# Patient Record
Sex: Female | Born: 1955 | Race: White | Hispanic: No | Marital: Married | Smoking: Never smoker
Health system: Southern US, Community
[De-identification: ages and names within clinical notes are randomized; demographics above are authoritative.]

## PROBLEM LIST (undated history)

## (undated) DIAGNOSIS — K219 Gastro-esophageal reflux disease without esophagitis: Secondary | ICD-10-CM

## (undated) DIAGNOSIS — D649 Anemia, unspecified: Secondary | ICD-10-CM

## (undated) DIAGNOSIS — E119 Type 2 diabetes mellitus without complications: Secondary | ICD-10-CM

## (undated) DIAGNOSIS — C50919 Malignant neoplasm of unspecified site of unspecified female breast: Secondary | ICD-10-CM

## (undated) DIAGNOSIS — M069 Rheumatoid arthritis, unspecified: Secondary | ICD-10-CM

## (undated) DIAGNOSIS — C8590 Non-Hodgkin lymphoma, unspecified, unspecified site: Secondary | ICD-10-CM

## (undated) DIAGNOSIS — I10 Essential (primary) hypertension: Secondary | ICD-10-CM

## (undated) DIAGNOSIS — F32A Depression, unspecified: Secondary | ICD-10-CM

## (undated) DIAGNOSIS — C801 Malignant (primary) neoplasm, unspecified: Secondary | ICD-10-CM

## (undated) DIAGNOSIS — K759 Inflammatory liver disease, unspecified: Secondary | ICD-10-CM

## (undated) HISTORY — PX: PARTIAL MASTECTOMY: SHX2703

## (undated) HISTORY — DX: Anemia, unspecified: D64.9

## (undated) HISTORY — DX: Rheumatoid arthritis, unspecified: M06.9

## (undated) HISTORY — DX: Depression, unspecified: F32.A

## (undated) HISTORY — PX: ABDOMINAL SURGERY: SHX537

## (undated) HISTORY — DX: Type 2 diabetes mellitus without complications: E11.9

## (undated) HISTORY — PX: EXCISION BIOPSY WITH NEEDLE LOCALIZATION: SHX2709

## (undated) HISTORY — PX: HYSTEROSCOPY: SHX211

## (undated) HISTORY — DX: Non-Hodgkin lymphoma, unspecified, unspecified site: C85.90

## (undated) HISTORY — DX: Malignant (primary) neoplasm, unspecified: C80.1

## (undated) HISTORY — DX: Malignant neoplasm of unspecified site of unspecified female breast: C50.919

## (undated) HISTORY — DX: Gastro-esophageal reflux disease without esophagitis: K21.9

## (undated) HISTORY — DX: Inflammatory liver disease, unspecified: K75.9

## (undated) HISTORY — DX: Essential (primary) hypertension: I10

## (undated) HISTORY — PX: AXILLARY NODE DISSECTION: SHX2708

---

## 1988-11-16 DIAGNOSIS — K759 Inflammatory liver disease, unspecified: Secondary | ICD-10-CM

## 1988-11-16 HISTORY — DX: Inflammatory liver disease, unspecified: K75.9

## 2015-05-10 DIAGNOSIS — M069 Rheumatoid arthritis, unspecified: Secondary | ICD-10-CM | POA: Insufficient documentation

## 2017-11-16 DIAGNOSIS — C8590 Non-Hodgkin lymphoma, unspecified, unspecified site: Secondary | ICD-10-CM

## 2017-11-16 HISTORY — DX: Non-Hodgkin lymphoma, unspecified, unspecified site: C85.90

## 2018-05-09 DIAGNOSIS — C8590 Non-Hodgkin lymphoma, unspecified, unspecified site: Secondary | ICD-10-CM | POA: Insufficient documentation

## 2018-11-16 HISTORY — PX: ABDOMINAL SURGERY: SHX537

## 2019-11-17 DIAGNOSIS — C50919 Malignant neoplasm of unspecified site of unspecified female breast: Secondary | ICD-10-CM

## 2019-11-17 HISTORY — DX: Malignant neoplasm of unspecified site of unspecified female breast: C50.919

## 2021-01-04 ENCOUNTER — Ambulatory Visit
Admission: RE | Admit: 2021-01-04 | Discharge: 2021-01-04 | Disposition: A | Discharge status: Home / Self Care | Payer: Self-pay | Source: Ambulatory Visit

## 2021-01-04 DIAGNOSIS — Z1239 Encounter for other screening for malignant neoplasm of breast: Secondary | ICD-10-CM

## 2021-03-25 DIAGNOSIS — C50919 Malignant neoplasm of unspecified site of unspecified female breast: Secondary | ICD-10-CM | POA: Insufficient documentation

## 2021-04-17 ENCOUNTER — Ambulatory Visit
Admission: RE | Admit: 2021-04-17 | Discharge: 2021-04-17 | Disposition: A | Discharge status: Home / Self Care | Payer: Self-pay | Source: Ambulatory Visit

## 2021-04-30 ENCOUNTER — Ambulatory Visit
Admission: RE | Admit: 2021-04-30 | Discharge: 2021-04-30 | Disposition: A | Discharge status: Home / Self Care | Payer: Self-pay | Source: Ambulatory Visit

## 2021-04-30 DIAGNOSIS — Z1239 Encounter for other screening for malignant neoplasm of breast: Secondary | ICD-10-CM

## 2021-06-17 ENCOUNTER — Ambulatory Visit
Admission: RE | Admit: 2021-06-17 | Discharge: 2021-06-17 | Disposition: A | Discharge status: Home / Self Care | Payer: Self-pay | Source: Ambulatory Visit

## 2021-06-17 DIAGNOSIS — Z1239 Encounter for other screening for malignant neoplasm of breast: Secondary | ICD-10-CM

## 2021-12-17 DIAGNOSIS — E119 Type 2 diabetes mellitus without complications: Secondary | ICD-10-CM | POA: Insufficient documentation

## 2022-01-25 ENCOUNTER — Ambulatory Visit
Admission: RE | Admit: 2022-01-25 | Discharge: 2022-01-25 | Disposition: A | Discharge status: Home / Self Care | Payer: Self-pay | Source: Ambulatory Visit

## 2022-01-25 DIAGNOSIS — Z1239 Encounter for other screening for malignant neoplasm of breast: Secondary | ICD-10-CM

## 2022-04-01 ENCOUNTER — Telehealth: Payer: Self-pay

## 2022-04-01 NOTE — Telephone Encounter (Signed)
Received records from [facility] Dr. Erskine Speed records. Still waiting for medical oncologist records.

## 2022-04-01 NOTE — Telephone Encounter (Signed)
Returned the patients call from 03/31/22 at 12:48 pm.  Spoke with the patient and she has recently moved to the area and her Non Hodgkin's Lymphoma is in remission.  She reviewed the I should reach out to provider Dr. Inetta Fermo to request her records to be faxed over to my attention.  We reviewed that once her records are received I will give her a call back .

## 2022-04-01 NOTE — Telephone Encounter (Signed)
[facility] New Patient Coordinator Note    Physician/Location Preference:    Location Preference: [facility]  Physician Preference: pt will do research and let me know    Referral:    Referring Provider: self     Is Referral required per insurance? No      History:    Personal Hx of Cancer: Yes - breast cancer     Prior Chemotherapy - No  Prior Radiation - Yes - Location Performed: Other no     Prior Surgery related to Cancer - Yes - Location Performed: Other partial [facility]aste[facility] for [facility]    Family Hx of Cancer :     Biopsy History:        Imaging History:    Prior Imaging: Yes    Type of Imaging: PET scan and mammo in April   Location Performed: [facility] [place]     Other:     Are there patient owned records that will be brought to the first appointment?No    Has the Appointment been scheduled? No: Will post date/time/physician/clinic when available   Sent request for medical records

## 2022-04-06 NOTE — Telephone Encounter (Signed)
[facility] New Patient Coordinator Note    Physician/Location Preference:    Location Preference: [facility]    Physician Preference: First Available    Referral:    Referring Provider: Transfer of Care from [place]    Is Referral required per insurance? No      History:    Personal Hx of Cancer: Yes - Follicular Lymphoma in remission and Breast Cancer       Has the Appointment been scheduled? Yes The patient has been scheduled with Dr. Haywood Lasso on 09/11/22 at 11 am at [facility].  I reviewed the location of the clinic with the patient.

## 2022-04-15 NOTE — Telephone Encounter (Signed)
Medical oncology records were received at heme department. Was able to pull records in epic. Called pt and left VM to call back and schedule. Also sent email to pt.

## 2022-04-22 NOTE — Telephone Encounter (Signed)
Called pt and was advised she will be out of town from 06/13-06/21 and need apt after that. Scheduled pt with Dr. Pandora Leiter on 06/30 @ 3pm in [facility]. Pt confirmed apt date time and location

## 2022-04-30 ENCOUNTER — Encounter (FREE_STANDING_LABORATORY_FACILITY): Payer: Medicare Other

## 2022-04-30 ENCOUNTER — Ambulatory Visit: Payer: Self-pay

## 2022-04-30 DIAGNOSIS — C50912 Malignant neoplasm of unspecified site of left female breast: Secondary | ICD-10-CM

## 2022-04-30 DIAGNOSIS — C779 Secondary and unspecified malignant neoplasm of lymph node, unspecified: Secondary | ICD-10-CM

## 2022-05-05 ENCOUNTER — Other Ambulatory Visit: Payer: Self-pay

## 2022-05-05 DIAGNOSIS — M25562 Pain in left knee: Secondary | ICD-10-CM

## 2022-05-05 LAB — LAB USE ONLY - HISTORICAL SURGICAL PATHOLOGY

## 2022-05-06 ENCOUNTER — Ambulatory Visit: Payer: Medicare Other

## 2022-05-08 ENCOUNTER — Other Ambulatory Visit: Payer: Self-pay

## 2022-05-08 ENCOUNTER — Ambulatory Visit
Admission: RE | Admit: 2022-05-08 | Discharge: 2022-05-08 | Disposition: A | Discharge status: Home / Self Care | Payer: Medicare Other | Source: Ambulatory Visit

## 2022-05-08 DIAGNOSIS — M25562 Pain in left knee: Secondary | ICD-10-CM | POA: Insufficient documentation

## 2022-05-11 ENCOUNTER — Other Ambulatory Visit: Payer: Self-pay

## 2022-05-11 DIAGNOSIS — K430 Incisional hernia with obstruction, without gangrene: Secondary | ICD-10-CM | POA: Insufficient documentation

## 2022-05-15 ENCOUNTER — Encounter: Payer: Self-pay | Admitting: Hematology & Oncology

## 2022-05-15 ENCOUNTER — Ambulatory Visit: Payer: Medicare Other | Attending: Hematology & Oncology | Admitting: Hematology & Oncology

## 2022-05-15 VITALS — BP 118/75 | HR 81 | Temp 97.9°F | Resp 18 | Ht 66.5 in | Wt 229.6 lb

## 2022-05-15 DIAGNOSIS — C8213 Follicular lymphoma grade II, intra-abdominal lymph nodes: Secondary | ICD-10-CM | POA: Insufficient documentation

## 2022-05-15 DIAGNOSIS — C50912 Malignant neoplasm of unspecified site of left female breast: Secondary | ICD-10-CM | POA: Insufficient documentation

## 2022-05-15 DIAGNOSIS — M8588 Other specified disorders of bone density and structure, other site: Secondary | ICD-10-CM | POA: Insufficient documentation

## 2022-05-15 DIAGNOSIS — C50112 Malignant neoplasm of central portion of left female breast: Secondary | ICD-10-CM | POA: Insufficient documentation

## 2022-05-15 DIAGNOSIS — Z170 Estrogen receptor positive status [ER+]: Secondary | ICD-10-CM | POA: Insufficient documentation

## 2022-05-15 NOTE — Progress Notes (Signed)
[facility]  [address] [place], [place], [place] [address]  P: [phone]      [facility]  [address], [place], [place] [address]  P: [phone] F: [phone]      Date of Encounter: May 15, 2022    Identification: LEFT breast cancer cT1c N0(sn) M0 (stage 1A) s/p partial mastectomy/SLNB. HR+/HER2 1+, Oncotype RS=11, radiation (EOT 10/20/21). 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 In remission by PET/CT as of 02/2022.    Referring provider: self.    Oncology care team:  Breast surgeon: Treated in [place]  Rad onc: Treated in [place]  Med-onc: Transferring care from [place] --> Dr.Briggitte Boline Pandora Leiter      HPI:  Stacie Christian is a 66 y.o. year old woman who relocated from [place] to move in with her daughter.   Available records have been reviewed.     In 2019, she presented with vomiting and abdominal pain and was found to have left upper abdominal mass inseparable from duodenum and jejunum as well as in the mesentery. S/p enteroscopy and exp lap. 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 S/p BR x 4 cycles (10/2018) and bypass surgery. Maintenance therapy with Rituximab q69months x 2 years.    PET/CT 03/07/21 showed CR for lymphoma but revealed a hypermetabolic focus in the inferior left breast (8mm, SUV 3.5)  04/17/21 MMG/US showed a 1.6cm irregular mass in the left breast (3 o'clock 7cm from the nipple) and a 1.2cm area of subtle architectural distortion in the left breast (1 o'clock middle depth)   Bx 3 o'clock lesion: invasive mammary carcinoma with DCIS (ER 100%, PR 95%, HER2 1+, Ki-67 30%).   Bx 1 o'clock lesion: benign fibrous stromal tissue.     06/17/21 LEFT breast partial mastectomy/SLNB: invasive mammary carcinoma, no special type, tumor size 1.2cm, No LVI, margins negative--pT1cN0  Oncotype RS=11 (distant recurrence risk  at 9 years with TAM or AI 13%, no chemotherapy benefit)    06/27/21 DEXA normal.   07/14/21 remission by PET/CT. No FDG avid lymphadenopathy.     S/p breast radiation (09/16/21-10/07/21) and boost (10/14/21-10/20/21)  Aromasin since 12/2021. Other than night sweats, she's been tolerating Aromasin well.     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   02/25/22 Bilateral MMG: benign    05/15/22 Established care with me.   She is scheduled to see Finis Bud in 08/2022.     Menopausal status: postmenopausal    Genetic test: N/A        Past Medical History:   Diagnosis Date    Anemia     Depression     Diabetes mellitus     Hepatitis     Hypertension     Malignant neoplasm        Past Surgical History:   Procedure Laterality Date    ABDOMINAL SURGERY  2022    AXILLARY NODE DISSECTION  2022    EXCISION BIOPSY WITH NEEDLE LOCALIZATION  2022    HYSTEROSCOPY      PARTIAL MASTECTOMY  2022       Family History   Problem Relation Age of Onset    Cancer Mother     Cancer Father     Breast cancer Maternal Aunt        Current Outpatient Medications on File Prior  to Visit   Medication Sig Dispense Refill    celecoxib (CeleBREX) 200 MG capsule TAKE ONE (1) CAPSULE(S) BY MOUTH ONCE A DAY WITH FOOD FOR 30 DAYS.      chlorthalidone 25 MG tablet Take 1 tablet (25 mg) by mouth daily      CRANBERRY PO Take 36 mg by mouth daily      esomeprazole (NexIUM) 40 MG capsule Take 1 capsule (40 mg) by mouth daily      exemestane (AROMASIN) 25 MG tablet       hydroxychloroquine (PLAQUENIL) 200 MG tablet Take 1 tablet (200 mg) by mouth daily      lisinopril (ZESTRIL) 20 MG tablet Take 1 tablet (20 mg) by mouth daily      montelukast (SINGULAIR) 10 MG tablet Take 1 tablet (10 mg) by mouth daily      PARoxetine (PAXIL) 20 MG tablet TAKE ONE (1) TABLET(S) BY MOUTH ONCE A DAY.      Semaglutide (Rybelsus) 7 MG Tab       UNABLE TO FIND Take 10 mg by mouth daily Med Name: Donperidone      vitamin D, ergocalciferol, (DRISDOL)  50000 UNIT Cap TAKE ONE (1) CAPSULE(S) BY MOUTH EVERY WEEK.       No current facility-administered medications on file prior to visit.       Social History     Socioeconomic History    Marital status: Married   Tobacco Use    Smoking status: Never    Smokeless tobacco: Never   Vaping Use    Vaping Use: Never used   Substance and Sexual Activity    Alcohol use: Not Currently    Drug use: Never    Sexual activity: Not Currently     Birth control/protection: None     Social Determinants of Health     Financial Resource Strain: Low Risk  (05/09/2022)    Overall Financial Resource Strain (CARDIA)     Difficulty of Paying Living Expenses: Not very hard   Food Insecurity: No Food Insecurity (05/09/2022)    Hunger Vital Sign     Worried About Running Out of Food in the Last Year: Never true     Ran Out of Food in the Last Year: Never true   Transportation Needs: No Transportation Needs (05/09/2022)    PRAPARE - Therapist, art (Medical): No     Lack of Transportation (Non-Medical): No   Physical Activity: Insufficiently Active (05/09/2022)    Exercise Vital Sign     Days of Exercise per Week: 2 days     Minutes of Exercise per Session: 20 min   Stress: No Stress Concern Present (05/09/2022)    [employer] of Occupational Health - Occupational Stress Questionnaire     Feeling of Stress : Only a little   Social Connections: Moderately Integrated (05/09/2022)    Social Connection and Isolation Panel [NHANES]     Frequency of Communication with Friends and Family: Three times a week     Frequency of Social Gatherings with Friends and Family: Once a week     Attends Religious Services: More than 4 times per year     Active Member of [employer] or Organizations: No     Attends Banker Meetings: Never     Marital Status: Married   Catering manager Violence: Not At Risk (05/09/2022)    Humiliation, Afraid, Rape, and Kick questionnaire     Fear of Current or  Ex-Partner: No     Emotionally Abused: No      Physically Abused: No     Sexually Abused: No   Housing Stability: Low Risk  (05/09/2022)    Housing Stability Vital Sign     Unable to Pay for Housing in the Last Year: No     Number of Places Lived in the Last Year: 2     Unstable Housing in the Last Year: No         ROS  Answers submitted by the patient for this visit:  Patient Intake Form (Submitted on 05/09/2022)  Chief Complaint: Symptoms  Did you ever breast feed?: Yes  fever: No  chills: No  Night sweats: No  fatigue: Yes  Unexpected weight loss: No  Unexpected weight gain: No  Appetite change: No  Frequent thirst: No  Double vision: No  Eye discharge: No  eye pain: No  Eye redness: No  hearing loss: No  Ringing in ears: No  ear pain: No  congestion: Yes  Sinus pain: No  Nose bleeds: No  sore throat: No  trouble swallowing: No  Voice change: No  Metallic or sour taste in mouth: No  difficulty breathing: No  Pain with breathing: No  Chronic cough: Yes  Bloody sputum: No  wheezing: Yes  Snoring: No  chest pain: No  palpitations: No  leg swelling: No  Pain walking: No  leg pain: No  Pacemaker problems: No  Irregular heartbeat: No  nausea: No  vomiting: No  diarrhea: No  abdominal pain: No  Bloody stool: No  constipation: No  heartburn: No  Regurgitation: No  Cold intolerance: No  Heat intolerance: No  Hot flashes: No  Hair loss: No  Difficulty urinating: No  urinary pain: No  Urinary frequency: Yes  bladder incontinence: Yes  Blood in urine: No  vaginal bleeding: No  vaginal discharge: No  pelvic pain: No  arthralgias: No  back pain: No  flank pain: No  myalgias: No  neck pain: No  Neck stiffness: Yes  muscle weakness: Yes  itching: No  rash: No  Hives: No  Wound: No  Skin changes: No  dizziness: No  headaches: No  seizures: No  numbness: No  tingling: No  tremors: No  weakness: No  memory loss: No  confusion: No  speech difficulty: No  Bruise or bleeds easily: Yes  Lymph node problems: No  nervous/anxious: No  Depressed mood: No  Feeling agitated: No  Panic  attack: No    Physical exam  Vitals:    05/15/22 1509   BP: 118/75   Pulse: 81   Resp: 18   Temp: 97.9 F (36.6 C)   SpO2: 97%   PS ECOG: 0  GENERAL APPEARANCE: well-developed, well-nourished, in no acute distress.  HEENT: Normocephalic and atraumatic. No scleral icterus. No conjunctival injection.   NECK: Supple. No thyroid enlargement.   BREASTS: LEFT partial mastectomy, well healed incisions. No palpable mass. +lymphedema in the left breast lower aspect and skin change c/w prior radiation. No axillary adenopathy  ABDOMEN: Soft, non-distended. No mass, tenderness, guarding, or rebound. No organomegaly or hernia. Bowel sounds are present.   EXTREMITIES: No cyanosis, clubbing, or edema.  NEUROLOGIC: The patient is awake, alert, and oriented x3. No focal sensory or motor deficits are noted. Gait is normal. Cranial nerves II through XII are intact.   PSYCHIATRIC: Appropriate mood and affect.  SKIN: No rashes in visualized area   LYMPHATICS: No bulky lymphadenopathy noted  Assessment/Plan  This is a 66 y.o. year old woman with     # stage 1A LEFT breast cancer, pT1c N0(sn) M0   -ER 100%, PR 95%, HER2 1+, Ki-67 30%  -06/17/21 LEFT breast partial mastectomy/SLNB: invasive mammary carcinoma, no special type, tumor size 1.2cm, No LVI, margins negative--pT1cN0  Oncotype RS=11 (distant recurrence risk at 9 years with TAM or AI 13%, no chemotherapy benefit)  -radiation  -Aromasin since 12/2021. Well tolerated so far.   -Continue Aromasin for 5 years at least (-12/2026)  -DEXA normal in 06/2021. Repeat DEXA in 06/2023  -MMG due 08/2022  -Refer to PT.   -She will have labs drawn with PCP next week and send the results to me.   -Continue Calcium/Vitamin D.   -F/u with me in 09/2022.    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  -Left upper abdominal mass inseparable from duodenum and jejunum as well as in the mesentery. S/p enteroscopy and exp lap.    -S/p BR x 4 cycles (10/2018) and bypass surgery. Maintenance  therapy with Rituximab q30months x 2 years.   -In remission by PET/CT (02/2022)   -Scheduled to see Dr.Hanlon in 08/2022.    # RA on Plaquenil  # DM, hypertension, GERD  # depression on Paxil.     Thank you for allowing me to participate in the care of this patient. Please call with questions.     My total visit time for this patient encounter was 60 minutes. This includes time spent preparing to see the patient, performing the exam, counselling patient/family, ordering and reviewing tests, medications and/or procedures, care coordination, and documenting clinical information in the record.      [place] Savon Bordonaro, MD  [facility]   P [phone]  F [phone]

## 2022-05-26 ENCOUNTER — Other Ambulatory Visit (FREE_STANDING_LABORATORY_FACILITY): Payer: Medicare Other

## 2022-05-26 ENCOUNTER — Other Ambulatory Visit: Payer: Medicare Other

## 2022-05-26 ENCOUNTER — Other Ambulatory Visit (INDEPENDENT_AMBULATORY_CARE_PROVIDER_SITE_OTHER): Payer: Self-pay

## 2022-05-26 DIAGNOSIS — I10 Essential (primary) hypertension: Secondary | ICD-10-CM

## 2022-05-26 DIAGNOSIS — M069 Rheumatoid arthritis, unspecified: Secondary | ICD-10-CM

## 2022-05-26 DIAGNOSIS — E119 Type 2 diabetes mellitus without complications: Secondary | ICD-10-CM

## 2022-05-26 LAB — CBC AND DIFFERENTIAL
Absolute NRBC: 0 10*3/uL (ref 0.00–0.00)
Basophils Absolute Automated: 0.04 10*3/uL (ref 0.00–0.08)
Basophils Automated: 0.6 %
Eosinophils Absolute Automated: 0.17 10*3/uL (ref 0.00–0.44)
Eosinophils Automated: 2.6 %
Hematocrit: 37.2 % (ref 34.7–43.7)
Hgb: 11.3 g/dL — ABNORMAL LOW (ref 11.4–14.8)
Immature Granulocytes Absolute: 0.02 10*3/uL (ref 0.00–0.07)
Immature Granulocytes: 0.3 %
Instrument Absolute Neutrophil Count: 4.95 10*3/uL (ref 1.10–6.33)
Lymphocytes Absolute Automated: 1.02 10*3/uL (ref 0.42–3.22)
Lymphocytes Automated: 15.4 %
MCH: 24.8 pg — ABNORMAL LOW (ref 25.1–33.5)
MCHC: 30.4 g/dL — ABNORMAL LOW (ref 31.5–35.8)
MCV: 81.8 fL (ref 78.0–96.0)
MPV: 9.5 fL (ref 8.9–12.5)
Monocytes Absolute Automated: 0.44 10*3/uL (ref 0.21–0.85)
Monocytes: 6.6 %
Neutrophils Absolute: 4.95 10*3/uL (ref 1.10–6.33)
Neutrophils: 74.5 %
Nucleated RBC: 0 /100 WBC (ref 0.0–0.0)
Platelets: 267 10*3/uL (ref 142–346)
RBC: 4.55 10*6/uL (ref 3.90–5.10)
RDW: 14 % (ref 11–15)
WBC: 6.64 10*3/uL (ref 3.10–9.50)

## 2022-05-26 LAB — VITAMIN B12: Vitamin B-12: 491 pg/mL (ref 211–911)

## 2022-05-26 LAB — COMPREHENSIVE METABOLIC PANEL
ALT: 17 U/L (ref 0–55)
AST (SGOT): 18 U/L (ref 5–41)
Albumin/Globulin Ratio: 1.7 (ref 0.9–2.2)
Albumin: 4 g/dL (ref 3.5–5.0)
Alkaline Phosphatase: 74 U/L (ref 37–117)
Anion Gap: 11 (ref 5.0–15.0)
BUN: 14 mg/dL (ref 7.0–21.0)
Bilirubin, Total: 0.4 mg/dL (ref 0.2–1.2)
CO2: 27 mEq/L (ref 17–29)
Calcium: 9.3 mg/dL (ref 8.5–10.5)
Chloride: 106 mEq/L (ref 99–111)
Creatinine: 0.9 mg/dL (ref 0.4–1.0)
Globulin: 2.3 g/dL (ref 2.0–3.6)
Glucose: 114 mg/dL — ABNORMAL HIGH (ref 70–100)
Potassium: 3.8 mEq/L (ref 3.5–5.3)
Protein, Total: 6.3 g/dL (ref 6.0–8.3)
Sodium: 144 mEq/L (ref 135–145)
eGFR: >60 mL/min/{1.73_m2} (ref >=60)

## 2022-05-26 LAB — RHEUMATOID FACTOR: Rheumatoid Factor: <13 IU/mL (ref 0.0–30.0)

## 2022-05-26 LAB — T4, FREE: T4 Free: 0.93 ng/dL (ref 0.69–1.48)

## 2022-05-26 LAB — URINALYSIS, REFLEX TO MICROSCOPIC EXAM IF INDICATED
Bilirubin, UA: NEGATIVE
Blood, UA: NEGATIVE
Glucose, UA: NEGATIVE
Ketones UA: NEGATIVE
Nitrite, UA: NEGATIVE
Protein, UR: NEGATIVE
Specific Gravity UA: 1.015 (ref 1.001–1.035)
Urine pH: 6.5 (ref 5.0–8.0)
Urobilinogen, UA: NORMAL mg/dL

## 2022-05-26 LAB — HEMOGLOBIN A1C
Average Estimated Glucose: 128.4 mg/dL
Hemoglobin A1C: 6.1 % — ABNORMAL HIGH (ref 4.6–5.6)

## 2022-05-26 LAB — MICROALBUMIN, RANDOM URINE
Urine Creatinine, Random: 100.2 mg/dL
Urine Microalbumin, Random: 6 ug/ml (ref 0.0–30.0)
Urine Microalbumin/Creatinine Ratio: 6 ug/mg (ref 0–30)

## 2022-05-26 LAB — TSH: TSH: 1.04 u[IU]/mL (ref 0.35–4.94)

## 2022-05-26 LAB — SEDIMENTATION RATE: Sed Rate: 32 mm/Hr — ABNORMAL HIGH (ref 0–20)

## 2022-05-26 LAB — URIC ACID: Uric acid: 6.3 mg/dL (ref 2.6–7.1)

## 2022-05-26 LAB — C-REACTIVE PROTEIN: C-Reactive Protein: 0.2 mg/dL (ref 0.0–1.1)

## 2022-05-26 LAB — HEMOLYSIS INDEX: Hemolysis Index: 4 Index (ref 0–24)

## 2022-06-02 ENCOUNTER — Ambulatory Visit (INDEPENDENT_AMBULATORY_CARE_PROVIDER_SITE_OTHER): Payer: Medicare Other

## 2022-06-02 ENCOUNTER — Encounter (INDEPENDENT_AMBULATORY_CARE_PROVIDER_SITE_OTHER): Payer: Self-pay

## 2022-06-02 NOTE — PSS Phone Screening (Signed)
PCP work up and surgeon's H&P in epic.

## 2022-06-02 NOTE — PSS Phone Screening (Signed)
Pre-Anesthesia Evaluation    Pre-op phone visit requested by:   Reason for pre-op phone visit: Patient anticipating LAPAROSCOPIC, HERNIORRHAPHY, INCISIONAL procedure.    Language Assistant  Interpreter: N/A - English is preferred language    No orders of the defined types were placed in this encounter.      History of Present Illness/Summary:        Problem List:  Medical Problems       Hospital Problem List  Date Reviewed: 05/15/2022   None        Non-Hospital Problem List  Date Reviewed: 05/15/2022            ICD-10-CM Priority Class Noted    Incisional hernia with obstruction but no gangrene K43.0   05/11/2022    Breast cancer C50.919   03/25/2021    DM (diabetes mellitus), type 2 E11.9   12/17/2021    Non Hodgkin's lymphoma C85.90   05/09/2018    Rheumatoid arthritis M06.9   05/10/2015    Follicular lymphoma grade II of intra-abdominal lymph nodes C82.13   05/15/2022    Malignant neoplasm of left breast in female, estrogen receptor positive, unspecified site of breast C50.912, Z17.0   05/15/2022        Medical History   Diagnosis Date    Anemia     Depression     Diabetes mellitus     Gastroesophageal reflux disease     Hepatitis     A    Hypertension     Lymphoma     Malignant neoplasm of breast     Rheumatoid arthritis     Type 2 diabetes mellitus, controlled      Past Surgical History:   Procedure Laterality Date    ABDOMINAL SURGERY  2022    AXILLARY NODE DISSECTION  2022    EXCISION BIOPSY WITH NEEDLE LOCALIZATION  2022    HYSTEROSCOPY      PARTIAL MASTECTOMY  2022        Medication List            Accurate as of June 02, 2022 11:37 AM. Always use your most recent med list.                celecoxib 200 MG capsule  TAKE ONE (1) CAPSULE(S) BY MOUTH ONCE A DAY WITH FOOD FOR 30 DAYS.  Commonly known as: CeleBREX  Medication Adjustments for Surgery: Hold day of surgery     chlorthalidone 25 MG tablet  Take 1 tablet (25 mg) by mouth daily  Medication Adjustments for Surgery: Hold day of surgery     CRANBERRY PO  Take 36 mg  by mouth daily  Medication Adjustments for Surgery: Hold day of surgery     esomeprazole 40 MG capsule  Take 1 capsule (40 mg) by mouth daily  Commonly known as: NexIUM  Medication Adjustments for Surgery: Take morning of surgery     exemestane 25 MG tablet  Commonly known as: AROMASIN  Medication Adjustments for Surgery: Take as prescribed     hydroxychloroquine 200 MG tablet  Take 1 tablet (200 mg) by mouth daily  Commonly known as: PLAQUENIL  Medication Adjustments for Surgery: Take as prescribed     lisinopril 20 MG tablet  Take 1 tablet (20 mg) by mouth daily  Commonly known as: ZESTRIL     montelukast 10 MG tablet  Take 1 tablet (10 mg) by mouth daily  Commonly known as: SINGULAIR  Medication Adjustments for Surgery: Hold day  of surgery     PARoxetine 20 MG tablet  Take 1 tablet (20 mg) by mouth every evening  Commonly known as: PAXIL  Medication Adjustments for Surgery: Take as prescribed     Rybelsus 7 MG Tabs  daily  Generic drug: Semaglutide  Medication Adjustments for Surgery: Hold day of surgery     UNABLE TO FIND  Take 10 mg by mouth daily Med Name: Donperidone  Medication Adjustments for Surgery: Hold day of surgery     vitamin D (ergocalciferol) 50000 UNIT Caps  TAKE ONE (1) CAPSULE(S) BY MOUTH EVERY WEEK.  Commonly known as: DRISDOL  Medication Adjustments for Surgery: Hold day of surgery            No Known Allergies  Family History   Problem Relation Age of Onset    Cancer Mother     Cancer Father     Breast cancer Maternal Aunt      Social History     Occupational History    Not on file   Tobacco Use    Smoking status: Never    Smokeless tobacco: Never   Vaping Use    Vaping Use: Never used   Substance and Sexual Activity    Alcohol use: Yes     Comment: rarely    Drug use: Never    Sexual activity: Not Currently     Birth control/protection: None       Menstrual History:   LMP / Status  Hysterectomy     No LMP recorded. Patient has had a hysterectomy.    Tubal Ligation?  No valid surgical or  medical questions entered.             Exam Scores:   SDB score Risk Category: No Risk    PONV score Nausea Risk: VERY SEVERE RISK    MST score MST Score: 0    Allergy score Risk Category: Low Risk    Frailty score CFS Score: 3       Visit Vitals  Ht 1.689 m (5' 6.5")   Wt 104.3 kg (230 lb)   BMI 36.57 kg/m       Recent Labs   CBC (last 180 days) 05/26/22  1132   WBC 6.64   RBC 4.55   Hgb 11.3*   Hematocrit 37.2   MCV 81.8   MCH 24.8*   MCHC 30.4*   RDW 14   Platelets 267   MPV 9.5   Nucleated RBC 0.0   Absolute NRBC 0.00     Recent Labs   BMP (last 180 days) 05/26/22  1132   Glucose 114*   BUN 14.0   Creatinine 0.9   Sodium 144   Potassium 3.8   Chloride 106   CO2 27   Calcium 9.3   Anion Gap 11.0   eGFR >60.0         Recent Labs   Other (last 180 days) 05/26/22  1132   TSH 1.04   Bilirubin, Total 0.4   ALT 17   AST (SGOT) 18   Protein, Total 6.3   Hemoglobin A1C 6.1*   Vitamin B-12 491

## 2022-06-04 ENCOUNTER — Encounter
Admission: RE | Disposition: A | Discharge status: Home / Self Care | Payer: Self-pay | Source: Ambulatory Visit | Attending: Surgery

## 2022-06-04 ENCOUNTER — Ambulatory Visit: Payer: Medicare Other | Admitting: Certified Registered"

## 2022-06-04 ENCOUNTER — Ambulatory Visit
Admission: RE | Admit: 2022-06-04 | Discharge: 2022-06-04 | Disposition: A | Discharge status: Home / Self Care | Payer: Medicare Other | Source: Ambulatory Visit | Attending: Surgery | Admitting: Surgery

## 2022-06-04 DIAGNOSIS — I10 Essential (primary) hypertension: Secondary | ICD-10-CM | POA: Insufficient documentation

## 2022-06-04 DIAGNOSIS — K432 Incisional hernia without obstruction or gangrene: Secondary | ICD-10-CM | POA: Diagnosis present

## 2022-06-04 DIAGNOSIS — Z8572 Personal history of non-Hodgkin lymphomas: Secondary | ICD-10-CM | POA: Insufficient documentation

## 2022-06-04 DIAGNOSIS — K430 Incisional hernia with obstruction, without gangrene: Secondary | ICD-10-CM | POA: Insufficient documentation

## 2022-06-04 HISTORY — PX: LAPAROSCOPIC, HERNIORRHAPHY, INCISIONAL: SHX4511

## 2022-06-04 LAB — GLUCOSE WHOLE BLOOD - POCT
Whole Blood Glucose POCT: 161 mg/dL — ABNORMAL HIGH (ref 70–100)
Whole Blood Glucose POCT: 164 mg/dL — ABNORMAL HIGH (ref 70–100)

## 2022-06-04 SURGERY — LAPAROSCOPIC, HERNIORRHAPHY, INCISIONAL
Anesthesia: Anesthesia General | Site: Abdomen | Wound class: Clean

## 2022-06-04 MED ORDER — PROPOFOL 10 MG/ML IV EMUL (WRAP)
INTRAVENOUS | Status: DC | PRN
Start: 2022-06-04 — End: 2022-06-04
  Administered 2022-06-04: 200 mg via INTRAVENOUS

## 2022-06-04 MED ORDER — ONDANSETRON HCL 4 MG/2ML IJ SOLN
4.0000 mg | Freq: Once | INTRAMUSCULAR | Status: DC | PRN
Start: 2022-06-04 — End: 2022-06-04

## 2022-06-04 MED ORDER — CEFAZOLIN SODIUM 1 G IJ SOLR
INTRAMUSCULAR | Status: AC
Start: 2022-06-04 — End: ?
  Filled 2022-06-04: qty 2000

## 2022-06-04 MED ORDER — LIDOCAINE HCL (PF) 1 % IJ SOLN
INTRAMUSCULAR | Status: DC | PRN
Start: 2022-06-04 — End: 2022-06-04
  Administered 2022-06-04: 50 mg via INTRAVENOUS

## 2022-06-04 MED ORDER — PHENYLEPHRINE 100 MCG/ML IV SOSY (WRAP)
PREFILLED_SYRINGE | INTRAVENOUS | Status: AC
Start: 2022-06-04 — End: ?
  Filled 2022-06-04: qty 10

## 2022-06-04 MED ORDER — KETOROLAC TROMETHAMINE 60 MG/2ML IM SOLN
INTRAMUSCULAR | Status: AC
Start: 2022-06-04 — End: ?
  Filled 2022-06-04: qty 2

## 2022-06-04 MED ORDER — BUPIVACAINE LIPOSOME 1.3 % IJ SUSP
INTRAMUSCULAR | Status: DC | PRN
Start: 2022-06-04 — End: 2022-06-04
  Administered 2022-06-04: 10 mL

## 2022-06-04 MED ORDER — FENTANYL CITRATE (PF) 50 MCG/ML IJ SOLN (WRAP)
INTRAMUSCULAR | Status: AC
Start: 2022-06-04 — End: 2022-06-04
  Administered 2022-06-04: 50 ug via INTRAVENOUS
  Filled 2022-06-04: qty 2

## 2022-06-04 MED ORDER — FENTANYL CITRATE (PF) 50 MCG/ML IJ SOLN (WRAP)
INTRAMUSCULAR | Status: AC
Start: 2022-06-04 — End: ?
  Filled 2022-06-04: qty 2

## 2022-06-04 MED ORDER — LACTATED RINGERS IV SOLN
INTRAVENOUS | Status: DC | PRN
Start: 2022-06-04 — End: 2022-06-04

## 2022-06-04 MED ORDER — SUGAMMADEX SODIUM 500 MG/5ML IV SOLN
INTRAVENOUS | Status: AC
Start: 2022-06-04 — End: ?
  Filled 2022-06-04: qty 5

## 2022-06-04 MED ORDER — LABETALOL HCL 5 MG/ML IV SOLN (WRAP)
INTRAVENOUS | Status: AC
Start: 2022-06-04 — End: ?
  Filled 2022-06-04: qty 4

## 2022-06-04 MED ORDER — ROCURONIUM BROMIDE 50 MG/5ML IV SOLN
INTRAVENOUS | Status: DC | PRN
Start: 2022-06-04 — End: 2022-06-04
  Administered 2022-06-04: 10 mg via INTRAVENOUS
  Administered 2022-06-04: 50 mg via INTRAVENOUS

## 2022-06-04 MED ORDER — MIDAZOLAM HCL 1 MG/ML IJ SOLN (WRAP)
INTRAMUSCULAR | Status: DC | PRN
Start: 2022-06-04 — End: 2022-06-04
  Administered 2022-06-04: 2 mg via INTRAVENOUS

## 2022-06-04 MED ORDER — FENTANYL CITRATE (PF) 50 MCG/ML IJ SOLN (WRAP)
INTRAMUSCULAR | Status: DC | PRN
Start: 2022-06-04 — End: 2022-06-04
  Administered 2022-06-04 (×4): 50 ug via INTRAVENOUS

## 2022-06-04 MED ORDER — MIDAZOLAM HCL 1 MG/ML IJ SOLN (WRAP)
INTRAMUSCULAR | Status: AC
Start: 2022-06-04 — End: ?
  Filled 2022-06-04: qty 2

## 2022-06-04 MED ORDER — HYDROMORPHONE HCL 1 MG/ML IJ SOLN
INTRAMUSCULAR | Status: DC | PRN
Start: 2022-06-04 — End: 2022-06-04
  Administered 2022-06-04 (×2): .4 mg via INTRAVENOUS
  Administered 2022-06-04: .2 mg via INTRAVENOUS

## 2022-06-04 MED ORDER — DEXAMETHASONE SODIUM PHOSPHATE 4 MG/ML IJ SOLN (WRAP)
INTRAMUSCULAR | Status: DC | PRN
Start: 2022-06-04 — End: 2022-06-04
  Administered 2022-06-04: 4 mg via INTRAVENOUS

## 2022-06-04 MED ORDER — DEXAMETHASONE SODIUM PHOSPHATE 4 MG/ML IJ SOLN
INTRAMUSCULAR | Status: AC
Start: 2022-06-04 — End: ?
  Filled 2022-06-04: qty 1

## 2022-06-04 MED ORDER — DEXAMETHASONE SODIUM PHOSPHATE 4 MG/ML IJ SOLN
INTRAMUSCULAR | Status: AC
Start: 2022-06-04 — End: ?
  Filled 2022-06-04: qty 2

## 2022-06-04 MED ORDER — SODIUM CHLORIDE BACTERIOSTATIC 0.9 % IJ SOLN
INTRAMUSCULAR | Status: AC
Start: 2022-06-04 — End: ?
  Filled 2022-06-04: qty 30

## 2022-06-04 MED ORDER — ROCURONIUM BROMIDE 50 MG/5ML IV SOLN
INTRAVENOUS | Status: AC
Start: 2022-06-04 — End: ?
  Filled 2022-06-04: qty 5

## 2022-06-04 MED ORDER — KETOROLAC TROMETHAMINE 30 MG/ML IJ SOLN
INTRAMUSCULAR | Status: DC | PRN
Start: 2022-06-04 — End: 2022-06-04
  Administered 2022-06-04: 30 mg via INTRAVENOUS

## 2022-06-04 MED ORDER — OXYCODONE-ACETAMINOPHEN 5-325 MG PO TABS
1.0000 | ORAL_TABLET | Freq: Once | ORAL | Status: AC | PRN
Start: 2022-06-04 — End: 2022-06-04

## 2022-06-04 MED ORDER — LIDOCAINE HCL (PF) 1 % IJ SOLN
1.0000 mL | Freq: Once | INTRAMUSCULAR | Status: AC | PRN
Start: 2022-06-04 — End: 2022-06-04
  Administered 2022-06-04: 0.25 mL via INTRADERMAL

## 2022-06-04 MED ORDER — PROPOFOL 10 MG/ML IV EMUL (WRAP)
INTRAVENOUS | Status: AC
Start: 2022-06-04 — End: ?
  Filled 2022-06-04: qty 40

## 2022-06-04 MED ORDER — LIDOCAINE HCL 1 % IJ SOLN
INTRAMUSCULAR | Status: DC | PRN
Start: 2022-06-04 — End: 2022-06-04
  Administered 2022-06-04: 8 mL

## 2022-06-04 MED ORDER — OXYCODONE-ACETAMINOPHEN 5-325 MG PO TABS
ORAL_TABLET | ORAL | Status: AC
Start: 2022-06-04 — End: 2022-06-04
  Administered 2022-06-04: 1 via ORAL
  Filled 2022-06-04: qty 1

## 2022-06-04 MED ORDER — HYDROMORPHONE HCL 1 MG/ML IJ SOLN
INTRAMUSCULAR | Status: AC
Start: 2022-06-04 — End: ?
  Filled 2022-06-04: qty 1

## 2022-06-04 MED ORDER — ONDANSETRON HCL 4 MG/2ML IJ SOLN
INTRAMUSCULAR | Status: AC
Start: 2022-06-04 — End: ?
  Filled 2022-06-04: qty 2

## 2022-06-04 MED ORDER — STERILE WATER FOR INJECTION IJ/IV SOLN (WRAP)
2.0000 g | INTRAMUSCULAR | Status: AC
Start: 2022-06-04 — End: 2022-06-04
  Administered 2022-06-04: 2 g via INTRAVENOUS

## 2022-06-04 MED ORDER — ONDANSETRON HCL 4 MG/2ML IJ SOLN
INTRAMUSCULAR | Status: DC | PRN
Start: 2022-06-04 — End: 2022-06-04
  Administered 2022-06-04: 4 mg via INTRAVENOUS

## 2022-06-04 MED ORDER — BUPIVACAINE HCL (PF) 0.5 % IJ SOLN
INTRAMUSCULAR | Status: DC | PRN
Start: 2022-06-04 — End: 2022-06-04
  Administered 2022-06-04: 20 mL

## 2022-06-04 MED ORDER — FENTANYL CITRATE (PF) 50 MCG/ML IJ SOLN (WRAP)
50.0000 ug | INTRAMUSCULAR | Status: DC | PRN
Start: 2022-06-04 — End: 2022-06-04

## 2022-06-04 MED ORDER — SODIUM CHLORIDE (PF) 0.9 % IJ SOLN
INTRAMUSCULAR | Status: AC
Start: 2022-06-04 — End: ?
  Filled 2022-06-04: qty 10

## 2022-06-04 MED ORDER — SUGAMMADEX SODIUM 200 MG/2ML IV SOLN
INTRAVENOUS | Status: DC | PRN
Start: 2022-06-04 — End: 2022-06-04
  Administered 2022-06-04: 400 mg via INTRAVENOUS

## 2022-06-04 MED ORDER — LIDOCAINE HCL (PF) 1 % IJ SOLN
INTRAMUSCULAR | Status: AC
Start: 2022-06-04 — End: ?
  Filled 2022-06-04: qty 5

## 2022-06-04 MED ORDER — SODIUM CHLORIDE 0.9 % IV SOLN
6.2500 mg | Freq: Once | INTRAVENOUS | Status: DC
Start: 2022-06-04 — End: 2022-06-04
  Filled 2022-06-04: qty 0.25

## 2022-06-04 MED ORDER — EPHEDRINE SULFATE 50 MG/ML IJ/IV SOLN (WRAP)
Status: AC
Start: 2022-06-04 — End: ?
  Filled 2022-06-04: qty 1

## 2022-06-04 MED ORDER — SODIUM CHLORIDE BACTERIOSTATIC 0.9 % IJ SOLN
INTRAMUSCULAR | Status: DC | PRN
Start: 2022-06-04 — End: 2022-06-04
  Administered 2022-06-04: 20 mL

## 2022-06-04 MED ORDER — LABETALOL HCL 5 MG/ML IV SOLN (WRAP)
INTRAVENOUS | Status: DC | PRN
Start: 2022-06-04 — End: 2022-06-04
  Administered 2022-06-04: 10 mg via INTRAVENOUS

## 2022-06-04 MED ORDER — OXYCODONE-ACETAMINOPHEN 5-325 MG PO TABS
1.0000 | ORAL_TABLET | ORAL | Status: DC | PRN
Start: 2022-06-04 — End: 2022-06-04

## 2022-06-04 MED ORDER — OXYCODONE-ACETAMINOPHEN 5-325 MG PO TABS
1.0000 | ORAL_TABLET | ORAL | 0 refills | Status: AC | PRN
Start: 2022-06-04 — End: 2022-06-11

## 2022-06-04 SURGICAL SUPPLY — 89 items
ADHESIVE SKIN CLOSURE DERMABOND ADVANCED (Skin Closure) ×1
ADHESIVE SKIN CLOSURE DERMABOND ADVANCED .7 ML LIQUID APPLICATOR (Skin Closure) ×1 IMPLANT
ADHESIVE SKNCLS 2 OCTYL CYNCRLT .7ML (Skin Closure) ×1
APPLICATOR CHLORAPREP 26 ML 70% ISOPROPYL ALCOHOL 2% CHLORHEXIDINE (Applicator) ×1 IMPLANT
APPLICATOR PRP 70% ISPRP 2% CHG 26ML (Applicator) ×2
APPLIER IN CLP TI MED LG E-CLP III SUP (Staplers)
APPLIER INTERNAL CLIP MEDIUM LARGE L33 (Staplers)
APPLIER INTERNAL CLIP MEDIUM LARGE L33 CM TITANIUM PISTOL GRIP GLARE (Staplers) IMPLANT
BINDER ABD STD UNV 30-45INX12IN LF NS (Patient Supply)
BINDER ABD STD UNV 46-62INX12IN LF NS (Patient Supply) ×1
BINDER ABDOMINAL L30-45 IN X H12 IN (Patient Supply)
BINDER ABDOMINAL L30-45 IN X H12 IN STANDARD UNIVERSAL ELASTIC HOOK (Patient Supply) IMPLANT
BINDER ABDOMINAL L46-62 IN X H12 IN (Patient Supply) ×1
BINDER ABDOMINAL L46-62 IN X H12 IN STANDARD UNIVERSAL ELASTIC HOOK (Patient Supply) IMPLANT
CLEANER INST 6ML MED PREKLN SOAK SHLD (Sterilization Supply) ×1
CLEANER INSTRUMENT 6 ML SOAK SHIELD (Sterilization Supply) ×1
CLEANER INSTRUMENT 6 ML SOAK SHIELD PRECLEAN TIP PROTECTION MEDIUM (Sterilization Supply) ×1 IMPLANT
DEVICE CLSR 0 GS-21 V-LOC 180 9IN TPR (Suture) ×1
DRESSING TRANSPARENT L12 IN X W8 IN (Dressing) ×2
DRESSING TRANSPARENT L12 IN X W8 IN POLYURETHANE ADHESIVE (Dressing) ×1 IMPLANT
DRESSING TRNS PU TGDRM 12X8IN LF STRL (Dressing) ×2
DRESSING TRNS PU TGDRM 6X3.5IN LF STRL (Dressing) ×4 IMPLANT
ELECTRODE ADULT PATIENT RETURN L9 FT REM POLYHESIVE ACRYLIC FOAM (Procedure Accessories) ×1 IMPLANT
ELECTRODE PATIENT RETURN L9 FT VALLEYLAB (Procedure Accessories) ×1
ELECTRODE PT RTN RM PHSV ACRL FM C30- LB (Procedure Accessories) ×1
GLOVE SRG PLISPRN 8 BGL PI INDCTR (Glove) ×1
GLOVE SURGICAL 8 BIOGEL PI INDICATOR (Glove) ×1
GLOVE SURGICAL 8 BIOGEL PI INDICATOR UNDERGLOVE POWDER FREE SMOOTH (Glove) ×1 IMPLANT
GOWN SRG 2XL XLNG SMARTGOWN LF STRL LVL (Gown) ×2
GOWN SURGICAL 2XL XLONG SMARTGOWN LEVEL 4 RAGLAN SLEEVE BREATHABLE (Gown) ×1 IMPLANT
GRASPER LAPAROSCOPIC L42 MM LONG (Instrument)
GRASPER LAPAROSCOPIC LONG FENESTRATE OD5 MM CONTROL TIP (Instrument) IMPLANT
GRASPER LAPSCP LNG CNTRL TIP 5MM 42MM (Instrument)
IRRIGATOR SCT STRKFL2 LF STRL TIP DISP (Respiratory Supplies)
IRRIGATOR SUCTION TIP STRYKEFLOW 2 (Respiratory Supplies) IMPLANT
KIT RM TURNOVER LF NS DISP (Kits) ×2
KIT ROOM TURNOVER NONSTERILE LATEX FREE DISPOSABLE (Kits) ×1 IMPLANT
MESH OVAL VENTRAL HERNIA L10.2 IN X W6.2 (Mesh) ×1 IMPLANT
MESH OVAL VENTRAL HERNIA L10.2 IN X W6.2 IN POLYPROPYLENE EPTFE (Mesh) IMPLANT
MESH SRG PP EPTFE OVL CMPSX L/P 10.2X6.2 (Mesh) ×1 IMPLANT
NEEDLE SPINAL BD OD22 GA L3 1/2 IN (Needles) ×1
NEEDLE SPINAL L3 1/2 IN REGULAR WALL QUINCKE TIP OD22 GA BD (Needles) ×1 IMPLANT
NEEDLE SPNL PP RW BD QNCK 22GA 3.5IN LF (Needles) ×1
PACK SRG LF STRL GN LAP SHR DISP (Tray) ×1
PACK SURGICAL GENERAL LAP SHARE (Tray) ×1
PACK SURGICAL GENERAL LAP SHARE MEDLINE INDUSTRIES, INC (Tray) ×1 IMPLANT
SET HIGH FLOW SMOKE EVACUATION (Tubing) ×1
SET HIGH FLOW SMOKE EVACUATION PNEUMOCLEAR TUBING (Tubing) ×1 IMPLANT
SET TUBING PNEUMOCLEAR HFLO SMK EVAC (Tubing) ×1
SHEARS SRG HRMN 36CM CRV TIP US LAPSCP (Procedure Accessories) ×1
SHEARS SURGICAL L36 CM CURVE TIP (Procedure Accessories) ×1
SHEARS SURGICAL L36 CM CURVE TIP HARMONIC ULTRASONIC LAPAROSCOPIC (Procedure Accessories) ×1 IMPLANT
SHEARS SURGICAL L36 CM CURVE TIP HARMONICÂ® ULTRASONIC LAPAROSCOPIC (Procedure Accessories) ×1 IMPLANT
SLEEVE LAPSCP UNV EPTH XCL 5MM 100MM (Procedure Accessories) ×2
SLEEVE LAPSCP UNV EPTH XCL 5MM 100MM LF (Procedure Accessories) ×2
SLEEVE TROCAR L100 MM UNIVERSAL STABILITY OD5 MM ENDOPATH XCEL (Procedure Accessories) ×2 IMPLANT
SOLUTION ANFG ISOPRPNL FM DVN DFGR FGOUT (Procedure Accessories) ×1
SOLUTION ANTIFOG NONTOXIC NONFLAMMABLE (Procedure Accessories) ×1
SOLUTION ANTIFOG NONTOXIC NONFLAMMABLE PAD DEVON ISOPROPANOL FOAM (Procedure Accessories) ×1 IMPLANT
SOLUTION IRR 0.9% NACL 1000ML LF STRL (Irrigation Solutions) ×1
SOLUTION IRRIGATION 0.9% SODIUM CHLORIDE (Irrigation Solutions) ×1
SOLUTION IRRIGATION 0.9% SODIUM CHLORIDE 1000 ML PLASTIC POUR BOTTLE (Irrigation Solutions) ×1 IMPLANT
SPONGE GAUZE L6 3/4 IN X W6 IN MEDIUM (Dressing) ×2
SPONGE GAUZE L6 3/4 IN X W6 IN MEDIUM ABSORBENT FLUFF DRY CRINKLE (Dressing) ×1 IMPLANT
SPONGE GZE CTTN MED KRLX 6.75X6IN LF (Dressing) ×2
SUTURE ABS 0 UR-6 VCL 27IN BRD COAT VIOL (Suture) ×1
SUTURE ABS 4-0 PS2 MNCRL MTPS 18IN MFL (Suture) ×1
SUTURE COATED VICRYL 0 UR-6 L27 IN BRAID (Suture) ×1 IMPLANT
SUTURE MONOCRYL 4-0 PS-2 L18 IN (Suture) ×1
SUTURE MONOCRYL 4-0 PS-2 L18 IN MONOFILAMENT UNDYED ABSORBABLE (Suture) ×1 IMPLANT
SUTURE V-LOC 180 0 GS-21 1/2 CIRCLE L9 IN ABS GREEN (Suture) ×1 IMPLANT
SUTURE V-LOC 180 0 GS-21 L9 IN TAPER (Suture) ×1
SYRINGE 10 ML GRADUATE NONPYROGENIC DEHP (Syringes, Needles) ×3
SYRINGE 10 ML GRADUATE NONPYROGENIC DEHP FREE PVC FREE LOK MEDICAL (Syringes, Needles) ×3 IMPLANT
SYRINGE MED 10ML LL LF STRL GRAD N-PYRG (Syringes, Needles) ×3
SYSTEM FIXATION PERMANENT 30 FASTENER (Anchor) ×2 IMPLANT
SYSTEM FIXATION PERMANENT 30 FASTENER CAPSURE PEEK STAINLESS STEEL (Anchor) ×1 IMPLANT
SYSTEM FX PEEK SS CPSR PERM 30 FSTNR HRN (Anchor) ×2 IMPLANT
SYSTEM POSITIONING BUILT IN ARM (Procedure Accessories) ×1
SYSTEM POSITIONING BUILT IN ARM PROTECTOR ANTISKID STRIP ADJUSTABLE (Procedure Accessories) ×1 IMPLANT
SYSTEM SURGYPAD POSITIONING 36IN (Procedure Accessories) ×1
TROCAR LAPAROSCOPIC BLADELESS STABILITY SLEEVE L100 MM OD12 MM (Laparoscopy Supplies) ×1 IMPLANT
TROCAR LAPAROSCOPIC STABILITY SLEEVE (Laparoscopy Supplies) ×1
TROCAR LAPSCP EPTH XCL 12MM 100MM LF (Laparoscopy Supplies) ×2
TROCAR LAPSCP EPTH XCL 5MM 100MM LF STRL (Laparoscopy Supplies) ×2
TROCAR OD5 MM L100 MM BLADELESS STABLE SLEEVE ENDOPATH XCEL ENDOSCOPIC (Laparoscopy Supplies) ×1 IMPLANT
WATER STERILE PLASTIC POUR BOTTLE 1000 (Irrigation Solutions) ×1
WATER STERILE PLASTIC POUR BOTTLE 1000 ML (Irrigation Solutions) ×1 IMPLANT
WATER STRL 1000ML LF PLS PR BTL (Irrigation Solutions) ×1

## 2022-06-04 NOTE — Anesthesia Postprocedure Evaluation (Signed)
Anesthesia Post Evaluation    Patient: Stacie Christian    Procedure(s) with comments:  LAPAROSCOPIC, HERNIORRHAPHY, INCISIONAL WITH MESH - WITH TAP BLOCK    Anesthesia type: general    Last Vitals:   Vitals Value Taken Time   BP 160/75 06/04/22 1200   Temp 36.6 C (97.9 F) 06/04/22 1146   Pulse 91 06/04/22 1200   Resp 20 06/04/22 1146   SpO2 95 % 06/04/22 1200                 Anesthesia Post Evaluation:     Patient Evaluated: PACU  Patient Participation: complete - patient participated              Anesthetic complications: No                      Signed by: Melina Schools, MD, 06/04/2022 12:45 PM

## 2022-06-04 NOTE — Interval H&P Note (Signed)
Patient seen and examined today. No acute changes to H&P. Proceed with surgery.  If needed paper copy of H&P will be placed on the chart.

## 2022-06-04 NOTE — Transfer of Care (Signed)
Anesthesia Transfer of Care Note    Patient: Stacie Christian    Procedures performed: Procedure(s) with comments:  LAPAROSCOPIC, HERNIORRHAPHY, INCISIONAL WITH MESH - WITH TAP BLOCK    Anesthesia type: General TIVA    Patient location:Phase I PACU    Last vitals:   Vitals:    06/04/22 1146   BP: 180/86   Pulse: 93   Resp: 20   Temp: 36.6 C (97.9 F)   SpO2: 94%       Post pain: Pt wincing in pain but still emerging from anesthesia. Fentanyl 100 mcg IV given in divided doses.     Mental Status:awake    Respiratory Function: tolerating nasal cannula    Cardiovascular: stable    Nausea/Vomiting: patient not complaining of nausea or vomiting    Hydration Status: adequate    Post assessment: no apparent anesthetic complications, no reportable events and no evidence of recall    Signed by: Marquis Lunch, CRNA  06/04/22 11:46 AM

## 2022-06-04 NOTE — Discharge Instr - AVS First Page (Addendum)
Laparoscopic Ventral/Incisional Hernia Repair    Bowel Cleansing    Prior to your surgery you may be asked to "prep your bowel" with a combination of cathartics and oral antibiotics.  This is done to make your operation safer. Most people tolerate the process quite well.  If you are having trouble keeping to the prescribed schedule simply slow down.  Drink the [employer] slower, chill it with ice, or mix it with a flavored beverage (ginger ale, sprite, etc.).  There is no reason to swallow everything at once.  Remember achieving a partial bowel prep is better than no bowel prep.    When do I go home?    Most people will be able to go home after surgery.  However, a brief overnight stay may be ordered. Plan accordingly.  Someone must drive you home.    Pain Control    You will be given pain medications at the time of surgery and a prescription to fill at the time of discharge.  Most of the pain is from bruising and swelling.  Medications like Motrin, Advil and Ibuprophen are very helpful after these operations.  If you are allowed to take these medications you should do so.  Two or three tablets every 8hrs during the first 5 days after surgery is recommended. Resume all the medications you were taking before surgery unless instructed otherwise.    Dressing changes/Wound care    You may be given an elastic abdominal binder to wear after surgery.  Its purpose is to compress the skin over the old hernia site to prevent the accumulation of fluid under the skin.  This will improve the cosmetic result.  Often we will place a roll of gauze over the hernia to aid in compression. It is OK to remove the binder and gauze to shower on the day after surgery.  Please replace the gauze and binder and wear 24hrs a day during the first week.  A clean cotton sock can be used to replace the gauze.    You will generally have 3 or 4 small incisions.  Bruising and tenderness in these areas is not uncommon.  A small amount of  drainage may occur from these incisions in the first 24hrs.  Increasing tenderness with continued drainage is not typical and should be reported to Dr. Annabelle Harman office.  Antibiotic ointments and "skin lotions" are not required for care of the incisions.  Daily washing with mild soap and water is all that is necessary.  The band aids can be removed after 24 hrs but leave the underlying tape on the skin to achieve the best cosmetic result.    Diet/Bowel function after surgery    Bowel activity after abdominal surgery can be very variable. Constipation, diarrhea, mild nausea and occasional vomiting can occur.  They result from variable responses to antibiotics, pain medicines and manipulation of the bowel. Most problems are mild and resolve in 24 to 48 hrs if they occur.  During this time good oral intake of non-caffinated liquids is more important than solid food.     Post surgery care    You will be asked to return to the office for your postoperative evaluation 7 to 10 days following your surgery.  Please call the office where you were last seen.  Do not go to your primary care physician with postoperative problems.  Notify our office first.  The instructions above are meant to be a guide.  Feel free to call the  office with any other issues.    Dr. Janne Napoleon may be reached at [phone]                  Post Anesthesia Discharge Instructions    Although you may be awake and alert in the recovery room, small amounts of anesthetic remain in your system for about 24 hours. You may feel tired and sleepy during this time.    You are advised to go directly home from the hospital.    Plan to stay at home and rest for the remainder of the day.    It is advisable to have someone with you at home for 24 hours after surgery.    Do not operate a motor vehicle, or any mechanical or electrical equipment for the next 24 hours.    Be careful when you are walking around, you may become dizzy. The effects of anesthesia and/or  medications are still present and drowsiness may occur.    Do not consume alcohol, tranquilizers, sleeping medications, or any other non prescribed medications for the remainder of the day.    Diet: Begin with liquids, progress your diet as tolerated or as directed by your surgeon. Nausea and vomiting may occur in the next 24 hours.

## 2022-06-04 NOTE — Anesthesia Preprocedure Evaluation (Signed)
Anesthesia Evaluation    AIRWAY    Mallampati: II    TM distance: >3 FB  Neck ROM: full  Mouth Opening:full  Planned to use difficult airway equipment: No CARDIOVASCULAR    cardiovascular exam normal       DENTAL                   PULMONARY    pulmonary exam normal     OTHER FINDINGS                                      Relevant Problems   ENDO   (+) DM (diabetes mellitus), type 2      OTHER   (+) Rheumatoid arthritis               Anesthesia Plan    ASA 2     general                                                    Signed by: Melina Schools, MD 06/04/22 8:39 AM

## 2022-06-04 NOTE — Discharge Summary -  Nursing (Signed)
Discharge criteria met. Patient is awake and alert. Pain level is controlled to tolerable level. Respirations easy and non labored. All vital signs remain stable. Surgical dressing clean dry and intact. Family is at bedside. Discharge instructions printed and discussed with patient and husband, both acknowledged understanding of instructions. Copy of instructions provided to patient. Peripheral IV removed. Patient escorted to main lobby by RN via wheelchair, family member to drive patient home.

## 2022-06-04 NOTE — Op Note (Signed)
FULL OPERATIVE NOTE    Date Time: 06/04/22 11:23 AM  Patient Name: Stacie Christian,Stacie Christian (MRN: [id number])  Attending Physician: Francee Nodal, MD      Date of Operation:   06/04/2022    Providers Performing:   Surgeon(s) and Role:     * Francee Nodal, MD - Primary    Surgical First Assistant(s):   First Assistant: Sheffield Slider    Operative Procedure:   LAPAROSCOPIC, HERNIORRHAPHY, INCISIONAL WITH MESH AND TAP BLOCK: 49594 (CPT)  6 x 15 cm  defect   Preoperative Diagnosis:   Pre-Op Diagnosis Codes:     * Incisional hernia with obstruction but no gangrene [K43.0]    Postoperative Diagnosis:   Post-Op Diagnosis Codes:     * Incisional hernia with obstruction but no gangrene [K43.0]  Incarcerated incisional hernia  6 x 15 cm defect     Anesthesia:   General    Findings:   Dominate midline hernia with incarcerated omentum 6 x 6 cm. Two smaller defects above in the midline and one below in midline.     Indications:   Pain    Operative Notes:   Patient prepped and draped in sterile fashion.  Abdomen entered at the left subcostal position with a 12 mm non-bladed trocar.  Once this had been confirmed to be a safe entry.  The abdomen was insufflated and 3 additional 5 mm working ports were placed low in the left lateral abdominal wall.  We then proceeded to evaluate the hernia defect.  Omental adhesions were taken down off of the anterior abdominal wall using Harmonic scalpel, blunt and sharp dissection in hemostatic fashion. The incarcerated midline incisional hernia had incarcerated omentum which was reduced.  The falciform ligament was taken down. An upper midline  and epigastric hernia were noted.  A lower midline hernia was noted.    We then confirmed that there were no bowel injuries or active bleeding, and measured the defects.  It was judged to be approximately 15 x 6  centimeters in size when measured from the apex of the highest to the inferior border of the lowest hernia.  The largest defect was approxmiately 6  cm x 6 cm and was closed with a running 0 V-loc 180  9 cm suture using a reduced pneumoperitoneum.     We then performed a bilateral tap block with a mixture of 30 mL of one half percent Marcaine and 30 mL of injectable saline.  The mixture was split into equal allotments  and injected into the transversus abdominis plane bilaterally using laparoscopic guidance for accuracy.    We then inserted a piece of [employer] Mesh through our largest trocar.  The mesh was 10.2 by 6.2 inches giving a wide 5 cm overlap in all areas.  It was oriented with respect to the vertical midline and secured to the anterior abdominal wall with 2 concentric rows of 5 mm Sure tacks.  Once we had confirmed no bowel injury and no active bleeding the pneumoperitoneum was released.  All ports were removed and the fascia at the 12 mm sites was closed with 0 Vicryl suture.  Skin incisions  Were closed with 4-0 Monocryl and surgical glue on the skin..  A compressive dressing was applied over the hernia defect.  No complications.              10.2 b    Estimated Blood Loss:   * No values recorded between 06/04/2022 10:01 AM and 06/04/2022 11:23  AM *    Implants:     Implant Name Type Inv. Item Serial No. Model No. Manufacturer Lot No. LRB No. Used Action   MESH SRG PP EPTFE OVL CMPSX L/P 10.2X6.2 - [id number] Mesh MESH SRG PP EPTFE OVL CMPSX L/P 10.2X6.2  [id number] [employer]ARD [employer]OL [id number] N/A 1 Implanted   SYSTEM FX PEEK SS CPSR PERM 30 FSTNR HRN - [id number] Anchor SYSTEM FX PEEK SS CPSR PERM 30 FSTNR HRN  [id number] [employer] [id number] N/A 1 Implanted   SYSTEM FX PEEK SS CPSR PERM 30 FSTNR HRN - [id number] Anchor SYSTEM FX PEEK SS CPSR PERM 30 FSTNR HRN  [id number] [employer]OL [id number] N/A 1 Implanted          Drains:   Drains: no      Specimens:   * No specimens in log *      Complications:   None    Signed by: Francee Nodal, MD  [facility]

## 2022-06-05 ENCOUNTER — Encounter: Payer: Self-pay | Admitting: Surgery

## 2022-06-23 ENCOUNTER — Telehealth: Payer: Self-pay | Admitting: Hematology & Oncology

## 2022-06-23 DIAGNOSIS — C50912 Malignant neoplasm of unspecified site of left female breast: Secondary | ICD-10-CM

## 2022-06-23 MED ORDER — EXEMESTANE 25 MG PO TABS
25.0000 mg | ORAL_TABLET | Freq: Every day | ORAL | 3 refills | Status: DC
Start: 2022-06-23 — End: 2023-04-26

## 2022-06-23 NOTE — Telephone Encounter (Signed)
FYI -     Patient LVM on triage line req refill for exemestane 25 mg. Pls send to [facility] on [address]     Ph# [phone]    Per last OV note dated 05/15/2022 - Patient is to cont medication for 5 years at least till 12/2026. Rx e-sent to [facility]. Patient informed.

## 2022-07-27 ENCOUNTER — Other Ambulatory Visit: Payer: Self-pay

## 2022-07-27 DIAGNOSIS — N393 Stress incontinence (female) (male): Secondary | ICD-10-CM

## 2022-07-27 DIAGNOSIS — N3281 Overactive bladder: Secondary | ICD-10-CM | POA: Insufficient documentation

## 2022-08-05 IMAGING — CR CHEST 2 VWS PA LAT
2 series · 2 of 2 positions shown · non-contrast
Comparison: 09/01/2021

HISTORY: Cough
TECHNIQUE: PA and lateral chest radiographs

[w chest pa]
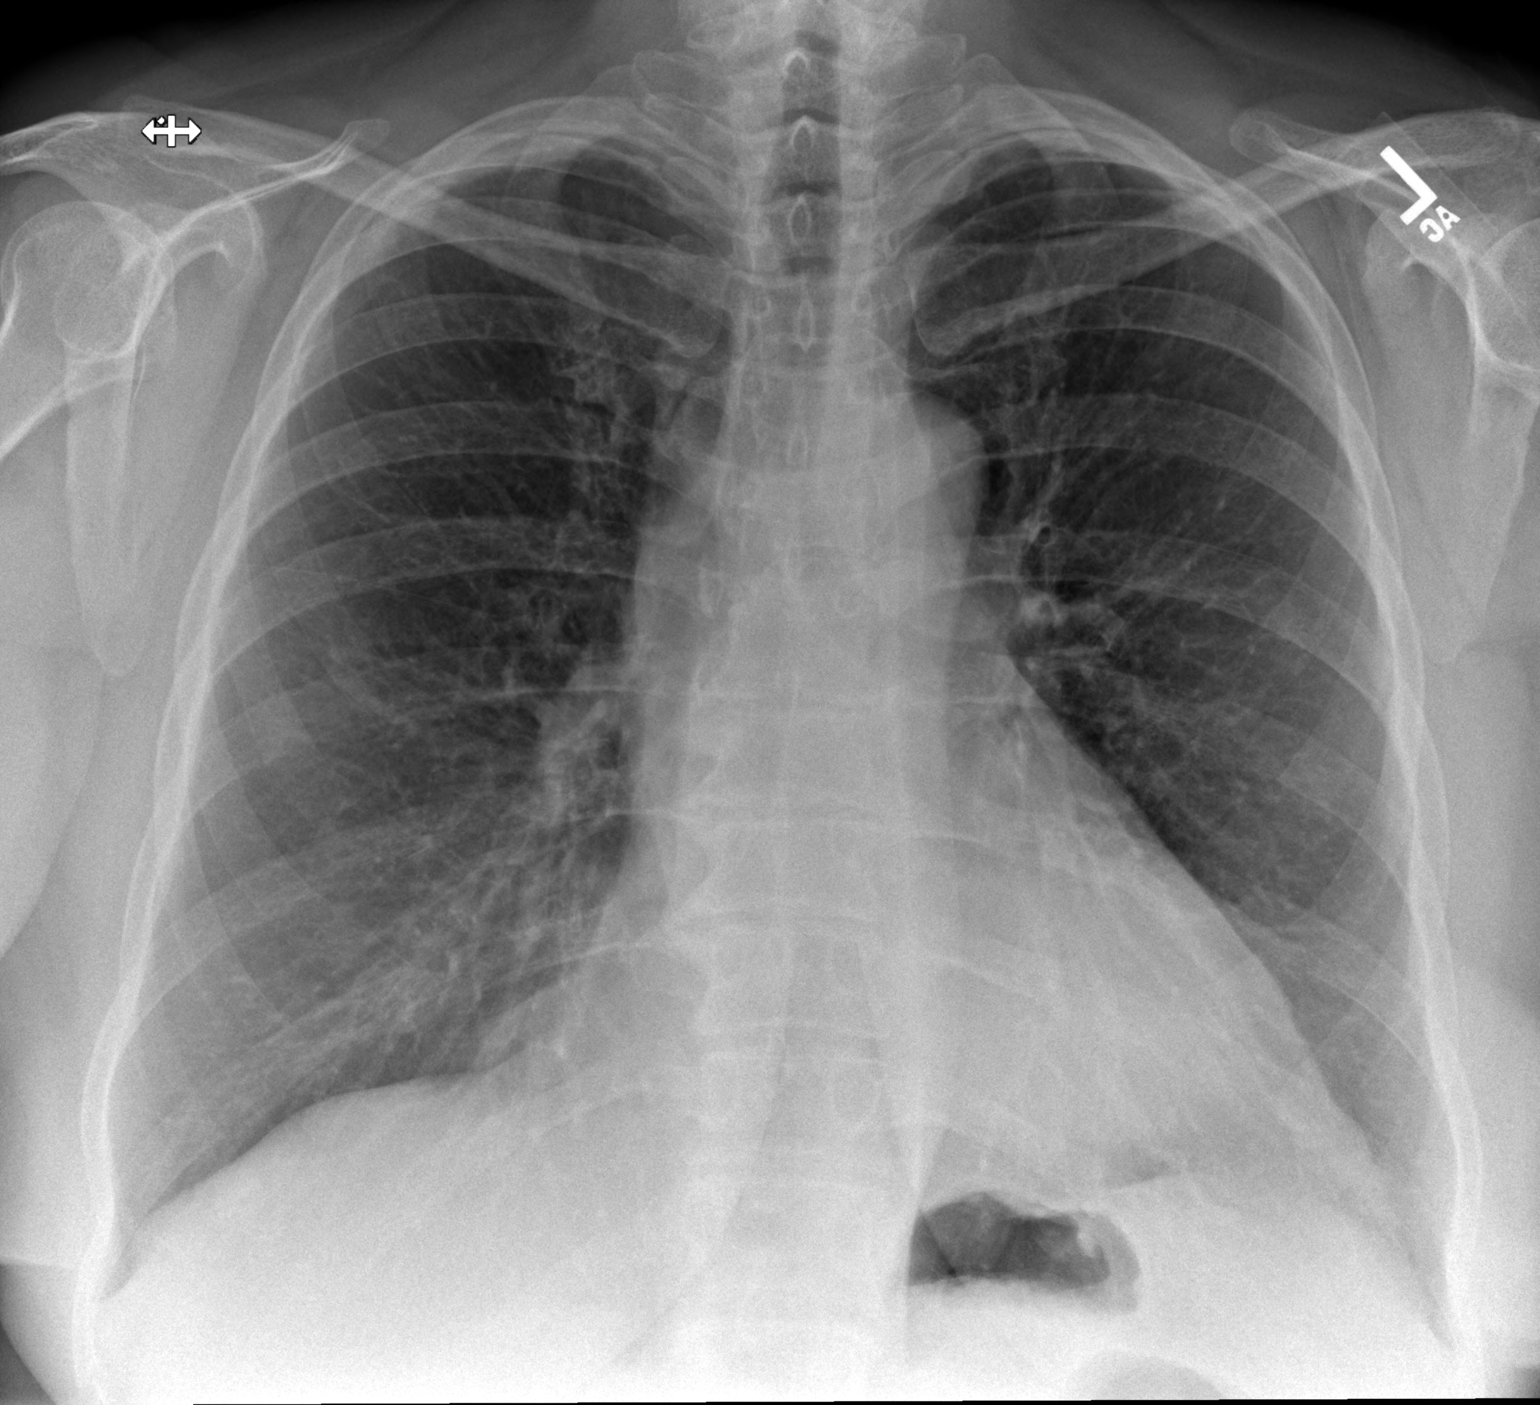

[w chest lat]
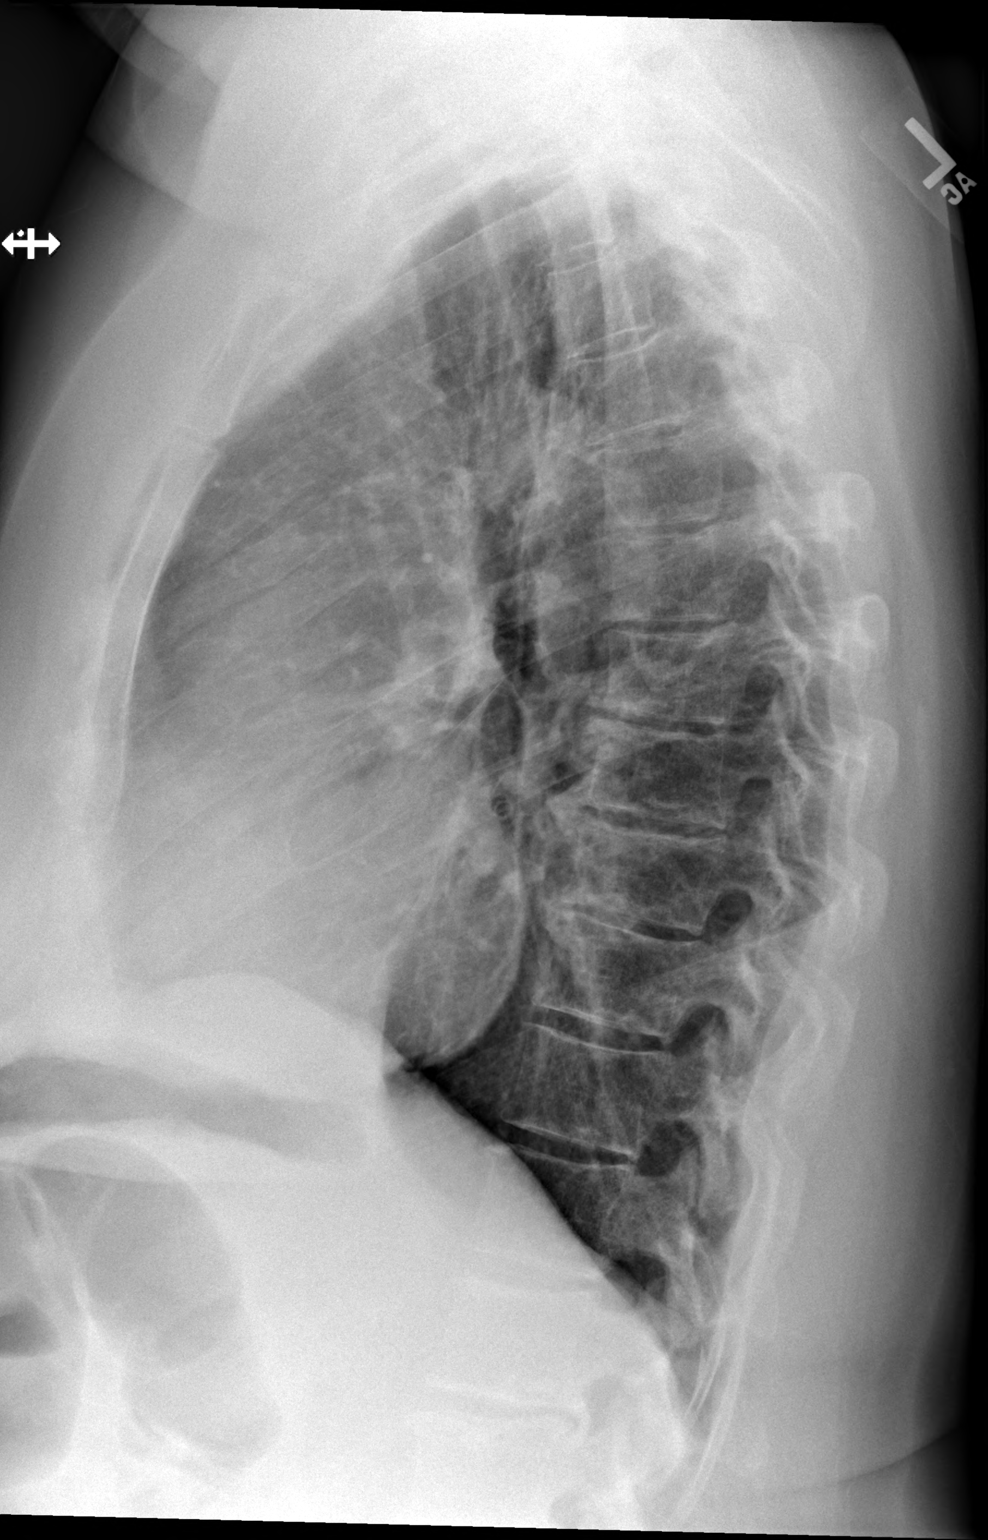

[2 of 2 positions shown; findings below may reference images not displayed]

FINDINGS: Heart size is normal. No pleural effusion or pneumothorax. No consolidation.
IMPRESSION: No acute chest findings.

## 2022-08-07 ENCOUNTER — Ambulatory Visit
Admission: RE | Admit: 2022-08-07 | Discharge: 2022-08-07 | Disposition: A | Discharge status: Home / Self Care | Payer: Medicare Other | Source: Ambulatory Visit | Attending: Nurse Practitioner | Admitting: Nurse Practitioner

## 2022-08-07 ENCOUNTER — Ambulatory Visit (INDEPENDENT_AMBULATORY_CARE_PROVIDER_SITE_OTHER): Payer: Medicare Other | Admitting: Nurse Practitioner

## 2022-08-07 ENCOUNTER — Encounter (INDEPENDENT_AMBULATORY_CARE_PROVIDER_SITE_OTHER): Payer: Self-pay | Admitting: Nurse Practitioner

## 2022-08-07 VITALS — BP 112/72 | HR 78 | Temp 98.2°F | Ht 66.0 in | Wt 212.0 lb

## 2022-08-07 DIAGNOSIS — C8213 Follicular lymphoma grade II, intra-abdominal lymph nodes: Secondary | ICD-10-CM

## 2022-08-07 DIAGNOSIS — C8590 Non-Hodgkin lymphoma, unspecified, unspecified site: Secondary | ICD-10-CM

## 2022-08-07 DIAGNOSIS — N3281 Overactive bladder: Secondary | ICD-10-CM

## 2022-08-07 DIAGNOSIS — N393 Stress incontinence (female) (male): Secondary | ICD-10-CM | POA: Insufficient documentation

## 2022-08-07 DIAGNOSIS — Z01818 Encounter for other preprocedural examination: Secondary | ICD-10-CM

## 2022-08-07 DIAGNOSIS — M069 Rheumatoid arthritis, unspecified: Secondary | ICD-10-CM

## 2022-08-07 DIAGNOSIS — C50912 Malignant neoplasm of unspecified site of left female breast: Secondary | ICD-10-CM

## 2022-08-07 DIAGNOSIS — E119 Type 2 diabetes mellitus without complications: Secondary | ICD-10-CM

## 2022-08-07 LAB — CBC AND DIFFERENTIAL
Absolute NRBC: 0 10*3/uL (ref 0.00–0.00)
Basophils Absolute Automated: 0.05 10*3/uL (ref 0.00–0.08)
Basophils Automated: 0.7 %
Eosinophils Absolute Automated: 0.18 10*3/uL (ref 0.00–0.44)
Eosinophils Automated: 2.6 %
Hematocrit: 36.4 % (ref 34.7–43.7)
Hgb: 11.6 g/dL (ref 11.4–14.8)
Immature Granulocytes Absolute: 0.02 10*3/uL (ref 0.00–0.07)
Immature Granulocytes: 0.3 %
Instrument Absolute Neutrophil Count: 5.43 10*3/uL (ref 1.10–6.33)
Lymphocytes Absolute Automated: 0.91 10*3/uL (ref 0.42–3.22)
Lymphocytes Automated: 12.9 %
MCH: 25.6 pg (ref 25.1–33.5)
MCHC: 31.9 g/dL (ref 31.5–35.8)
MCV: 80.4 fL (ref 78.0–96.0)
MPV: 9.6 fL (ref 8.9–12.5)
Monocytes Absolute Automated: 0.46 10*3/uL (ref 0.21–0.85)
Monocytes: 6.5 %
Neutrophils Absolute: 5.43 10*3/uL (ref 1.10–6.33)
Neutrophils: 77 %
Nucleated RBC: 0 /100 WBC (ref 0.0–0.0)
Platelets: 256 10*3/uL (ref 142–346)
RBC: 4.53 10*6/uL (ref 3.90–5.10)
RDW: 14 % (ref 11–15)
WBC: 7.05 10*3/uL (ref 3.10–9.50)

## 2022-08-07 LAB — URINALYSIS, REFLEX TO MICROSCOPIC EXAM IF INDICATED
Bilirubin, UA: NEGATIVE
Blood, UA: NEGATIVE
Glucose, UA: NEGATIVE
Ketones UA: NEGATIVE
Nitrite, UA: POSITIVE — AB
Protein, UR: 30 — AB
Specific Gravity UA: 1.019 (ref 1.001–1.035)
Urine pH: 5 (ref 5.0–8.0)
Urobilinogen, UA: NEGATIVE mg/dL (ref 0.2–2.0)

## 2022-08-07 LAB — COMPREHENSIVE METABOLIC PANEL
ALT: 17 U/L (ref 0–55)
AST (SGOT): 18 U/L (ref 5–41)
Albumin/Globulin Ratio: 1.7 (ref 0.9–2.2)
Albumin: 4 g/dL (ref 3.5–5.0)
Alkaline Phosphatase: 85 U/L (ref 37–117)
Anion Gap: 10 (ref 5.0–15.0)
BUN: 15 mg/dL (ref 7.0–21.0)
Bilirubin, Total: 0.6 mg/dL (ref 0.2–1.2)
CO2: 24 mEq/L (ref 17–29)
Calcium: 9.6 mg/dL (ref 8.5–10.5)
Chloride: 106 mEq/L (ref 99–111)
Creatinine: 1 mg/dL (ref 0.4–1.0)
Globulin: 2.4 g/dL (ref 2.0–3.6)
Glucose: 131 mg/dL — ABNORMAL HIGH (ref 70–100)
Potassium: 3.2 mEq/L — ABNORMAL LOW (ref 3.5–5.3)
Protein, Total: 6.4 g/dL (ref 6.0–8.3)
Sodium: 140 mEq/L (ref 135–145)
eGFR: >60 mL/min/{1.73_m2} (ref >=60)

## 2022-08-07 NOTE — Discharge Instructions (Signed)
Surgery Entrance: Door 14  Arrival Time:  2 hours prior to surgery start time    Please bring your picture ID, insurance card and co-pay.  Our billing phone number is [phone].  Wear comfortable, loose clothing.  Leave valuables at home.  No jewelry day of surgery.  If you become sick prior to surgery, please notify your surgeon.    No solid food after 11pm the night before your surgery.  You may have clear liquids up to 4 hours before your procedure. Examples include water, apple juice, sports drinks such as Gatorade, coffee or tea without cream or milk. Sugar or sweetener may be added.     Do not take NSAIDs meds like aspirin, Aleve, Advil, Motrin, ibuprofen 7 days before surgery  No supplements for 7 days before surgery  Ok to take regular tylenol  No alcohol 48 hours before surgery    Protect Your Skin from Injury  If you develop any injury, rash, insect bite, etc. please notify your surgeon ASAP.  A break in the skin could result in the cancellation of your surgery.    Preventing Post-operative Complications  Several practices after surgery will help prevent potentially serious complications such as pneumonia and blood clots.   Please remain as active as you can safely after surgery. Mobilizing early and often (walking, practicing "ankle pump" exercise) will help prevent blood from pooling and/or clotting in your legs.  Please use the incentive spirometer (plastic breathing tool will be given after surgery) 10 breaths per hour while in the hospital and until you have stopped taking opioids for pain control. Deep breathing exercises will help keep your lungs strong.  If you use a CPAP, use it every night before surgery and bring it the day of surgery if possible.

## 2022-08-07 NOTE — PEC In-Person Visit (H&P) (Addendum)
Pre-Anesthesia Evaluation     Pre-op Interview visit requested by:   Reason for pre-op interview visit: Patient anticipating INSERTION, TRANSVAGINAL TAPE, BLADDER SUSPENSION, CYSTO, CYSTOSCOPY procedure.         History of Present Illness/Summary:  Patient presents to the [facility] for a pre-operative evaluation.    Assessment/Plan:    Patient is at acceptable risk to proceed with planned surgery.  Potassium level today 3.2.  Pt advised to eat foods rich in potassium.    1.  Pre-op examination [Z01.818]    Orders Placed This Encounter   Procedures    Urine culture    CBC and differential    Comprehensive metabolic panel       2.  Surgical Diagnosis:  Stress incontinence, female [N39.3]  Overactive bladder [N32.81]    3.  HTN:  on lisinopril, chlorthalidone.    4.  RA:  on plaquenil.    5.  DM:  On semaglutide.    6.  GERD:  On esomeprazole.    7.  Breast CA:  s/p L partial mastectomy, radiation 2021.  On exemestane.  Followed by Dr. Pandora LeiterKang.    8.  Non-Hodgkins lymphoma:  s/p chemo, abdominal bypass surgery 2019.  Scheduled to f/u with Dr. Haywood LassoHanlon 08/2022.    Cardiac studies:    EKG 05/26/22, SR with VR 69    Echocardiogram, 02/21/21  Normal.  LVEF estimated 55-60%    Patient voiced understanding of all instructions.  All questions and concerns addressed at this time. This assessment will be conveyed to the surgery and anesthesiology teams & the patient will be evaluated the morning of surgery.      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   05/15/2022    Malignant neoplasm of left breast in female, estrogen receptor positive,  unspecified site of breast C50.912, Z17.0   05/15/2022    Stress incontinence, female N39.3   07/27/2022    Overactive bladder N32.81   07/27/2022        Medical History   Diagnosis Date    Anemia     Depression     Diabetes mellitus     Gastroesophageal reflux disease     Hepatitis 1990    A    Hypertension     Lymphoma 2019    non-Hodgkins, surgery and chemo    Malignant neoplasm of breast 2021    L partial mastectomy, radiation    Rheumatoid arthritis     Type 2 diabetes mellitus, controlled      Past Surgical History:   Procedure Laterality Date    ABDOMINAL SURGERY  2020    AXILLARY NODE DISSECTION  2022    EXCISION BIOPSY WITH NEEDLE LOCALIZATION  2022    HYSTEROSCOPY      LAPAROSCOPIC, HERNIORRHAPHY, INCISIONAL N/A 06/04/2022    Procedure: LAPAROSCOPIC, HERNIORRHAPHY, INCISIONAL WITH MESH;  Surgeon: Francee NodalGillian, George K, MD;  Location: [facility];  Service: General;  Laterality: N/A;  WITH TAP BLOCK    PARTIAL MASTECTOMY  2022        Medication List            Accurate as of August 07, 2022  1:43 PM. Always use your most recent med list.                celecoxib 200 MG capsule  TAKE ONE (1) CAPSULE(S) BY MOUTH ONCE A DAY WITH FOOD FOR 30 DAYS.  Commonly known as: CeleBREX  Medication Adjustments for Surgery: Stop 7 days before surgery     chlorthalidone 25 MG tablet  Take 1 tablet (25 mg) by mouth daily  Medication Adjustments for Surgery: Take morning of surgery     CRANBERRY PO  Take 36 mg by mouth daily  Medication Adjustments for Surgery: Stop 7 days before surgery     esomeprazole 40 MG capsule  Take 1 capsule (40 mg) by mouth daily  Commonly known as: NexIUM  Medication Adjustments for Surgery: Take morning of surgery     exemestane 25 MG tablet  Take 1 tablet (25 mg) by mouth daily  Commonly known as: AROMASIN  Medication Adjustments for Surgery: Take as prescribed     hydroxychloroquine 200 MG tablet  Take 1 tablet (200 mg) by mouth nightly  Commonly known as: PLAQUENIL  Medication Adjustments for  Surgery: Take as prescribed     lisinopril 20 MG tablet  Take 1 tablet (20 mg) by mouth daily  Commonly known as: ZESTRIL  Medication Adjustments for Surgery: Hold day of surgery     montelukast 10 MG tablet  Take 1 tablet (10 mg) by mouth nightly  Commonly known as: SINGULAIR  Medication Adjustments for Surgery: Take as prescribed     PARoxetine 20 MG tablet  Take 1 tablet (20 mg) by mouth every evening  Commonly known as: PAXIL  Medication Adjustments for Surgery: Take as prescribed     Rybelsus 7 MG Tabs  Take 1 tablet (7 mg) by mouth daily  Generic drug: Semaglutide  Medication Adjustments for Surgery: Stop 7 days before surgery     UNABLE TO FIND  Take 10 mg by mouth daily Med Name: Donperidone     UNABLE TO FIND  Med Name: motilium domperidona 10 mg TID before meals  Medication Adjustments for Surgery: Hold day of surgery     vitamin D (ergocalciferol) 50000 UNIT Caps  TAKE ONE (1) CAPSULE(S) BY MOUTH EVERY WEEK.  Commonly known as: DRISDOL  Medication Adjustments for Surgery: Hold day of surgery            No Known Allergies  Social History     Occupational History    Not on file   Tobacco Use    Smoking status: Never    Smokeless tobacco: Never   Vaping Use    Vaping Use: Never used   Substance and Sexual Activity    Alcohol use: Never     Comment: rarely    Drug use: Never    Sexual activity: Not Currently     Birth control/protection: None       Menstrual History:   LMP / Status  Hysterectomy     No LMP recorded. Patient has had a hysterectomy.    Tubal Ligation?  No valid surgical or medical questions entered.     Exam Scores:   SDB score  Risk Category: No Risk    PONV score  Nausea Risk: SEVERE RISK  MST score  MST Score: 0    Allergy score       Frailty score  CFS Score: 2    MICA  MICA %: 0.05    RCRI score  RCRI Score: 0    DASI  DASI Score: 18.95  METs Level: 5.07    EA-DIVA          Visit Vitals  BP 112/72   Pulse 78   Temp 98.2 F (36.8 C)   Ht 1.676 m (5\' 6" )   Wt 96.2 kg (212 lb)   SpO2 98%    BMI 34.22 kg/m       Review of Systems   Constitutional: Negative.    HENT: Negative.     Eyes: Negative.    Respiratory: Negative.     Cardiovascular: Negative.    Gastrointestinal: Negative.    Genitourinary:         Incontinence   Musculoskeletal: Negative.    Skin: Negative.    Neurological: Negative.    Endo/Heme/Allergies: Negative.    Psychiatric/Behavioral: Negative.         Physical Exam:  Mallampati: II  TM distance: > 3 FB (> 6 cm)  Mouth opening: > 3 FB (> 6 cm)  Neck extension: full  (-) enlarged neck circumference    Normal dentition    Normal neurological exam  Mental status: alert and oriented x3  Motor Exam Performed: upper left - normal; upper right - normal; lower left - normal; lower right - normal  Sensory Exam Performed: upper left - normal; upper right - normal; lower left - normal; lower right - normal    Normal cardiovascular exam  Heart rhythm: regular  no murmur:    Examined Pulses: left carotid - normal; right carotid - normal; left radial - normal; right radial - normal  (-) a murmur and peripheral edema    Normal pulmonary exam  Breath sounds clear to auscultation bilaterally    Normal thoracolumbar exam  Normal extremity exam    Normal abdominal exam.  Abdomen: soft  Bowel sounds: normal    Skin CDI     Recent Labs   CBC (last 180 days) 05/26/22  1132 08/07/22  1000   WBC 6.64 7.05   RBC 4.55 4.53   Hgb 11.3* 11.6   Hematocrit 37.2 36.4   MCV 81.8 80.4   MCH 24.8* 25.6   MCHC 30.4* 31.9   RDW 14 14   Platelets 267 256   MPV 9.5 9.6   Nucleated RBC 0.0 0.0   Absolute NRBC 0.00 0.00     Recent Labs   BMP (last 180 days) 05/26/22  1132 08/07/22  1000   Glucose 114* 131*   BUN 14.0 15.0   Creatinine 0.9 1.0   Sodium 144 140   Potassium 3.8 3.2*   Chloride 106 106   CO2 27 24   Calcium 9.3 9.6   Anion Gap 11.0 10.0   eGFR >60.0 >60.0         Recent Labs   Other (last 180 days) 05/26/22  1132 08/07/22  1000   TSH 1.04  --    Bilirubin, Total 0.4 0.6   ALT 17 17   AST (SGOT) 18 18    Protein, Total 6.3 6.4   Hemoglobin A1C 6.1*  --    Vitamin B-12 491  --              Anesthesia Plan:  ASA 3  Potential anesthesia/Perioperative problems: No Problems Identified          A discussion with regarding next steps to prepare for the procedure and the planned anesthesia care took place during today's visit.  I explained that the patient will meet with their anesthesiology providers on the DOS.  Discussed with Patient

## 2022-08-10 MED ORDER — AMOXICILLIN-POT CLAVULANATE 875-125 MG PO TABS
1.0000 | ORAL_TABLET | Freq: Two times a day (BID) | ORAL | 0 refills | Status: AC
Start: 2022-08-10 — End: 2022-08-17

## 2022-08-10 NOTE — Addendum Note (Signed)
Addended by: Victorio Palm on: 08/10/2022 10:01 AM     Modules accepted: Orders

## 2022-08-12 ENCOUNTER — Inpatient Hospital Stay: Payer: Medicare Other

## 2022-08-12 NOTE — PSS Phone Screening (Signed)
[facility]

## 2022-08-14 ENCOUNTER — Inpatient Hospital Stay: Payer: Medicare Other

## 2022-08-17 ENCOUNTER — Inpatient Hospital Stay: Payer: Medicare Other | Admitting: Rehabilitative and Restorative Service Providers"

## 2022-08-20 ENCOUNTER — Ambulatory Visit
Admission: RE | Admit: 2022-08-20 | Discharge: 2022-08-20 | Disposition: A | Discharge status: Home / Self Care | Payer: Medicare Other | Attending: Urology | Admitting: Urology

## 2022-08-20 ENCOUNTER — Inpatient Hospital Stay: Payer: Medicare Other

## 2022-08-20 ENCOUNTER — Encounter
Admission: RE | Disposition: A | Discharge status: Home / Self Care | Payer: Self-pay | Source: Home / Self Care | Attending: Urology

## 2022-08-20 ENCOUNTER — Encounter: Payer: Self-pay | Admitting: Urology

## 2022-08-20 ENCOUNTER — Ambulatory Visit: Payer: Medicare Other

## 2022-08-20 DIAGNOSIS — Z9012 Acquired absence of left breast and nipple: Secondary | ICD-10-CM | POA: Insufficient documentation

## 2022-08-20 DIAGNOSIS — Z79890 Hormone replacement therapy (postmenopausal): Secondary | ICD-10-CM | POA: Insufficient documentation

## 2022-08-20 DIAGNOSIS — I10 Essential (primary) hypertension: Secondary | ICD-10-CM | POA: Insufficient documentation

## 2022-08-20 DIAGNOSIS — M069 Rheumatoid arthritis, unspecified: Secondary | ICD-10-CM | POA: Insufficient documentation

## 2022-08-20 DIAGNOSIS — N393 Stress incontinence (female) (male): Secondary | ICD-10-CM | POA: Insufficient documentation

## 2022-08-20 DIAGNOSIS — N3281 Overactive bladder: Secondary | ICD-10-CM | POA: Insufficient documentation

## 2022-08-20 DIAGNOSIS — N3641 Hypermobility of urethra: Secondary | ICD-10-CM | POA: Insufficient documentation

## 2022-08-20 DIAGNOSIS — Z7984 Long term (current) use of oral hypoglycemic drugs: Secondary | ICD-10-CM | POA: Insufficient documentation

## 2022-08-20 DIAGNOSIS — E119 Type 2 diabetes mellitus without complications: Secondary | ICD-10-CM | POA: Insufficient documentation

## 2022-08-20 DIAGNOSIS — Z853 Personal history of malignant neoplasm of breast: Secondary | ICD-10-CM | POA: Insufficient documentation

## 2022-08-20 HISTORY — PX: INSERTION, TRANSVAGINAL TAPE, BLADDER SUSPENSION, CYSTO: SHX4407

## 2022-08-20 HISTORY — PX: CYSTOSCOPY: SHX3552

## 2022-08-20 LAB — GLUCOSE WHOLE BLOOD - POCT
Whole Blood Glucose POCT: 132 mg/dL — ABNORMAL HIGH (ref 70–100)
Whole Blood Glucose POCT: 135 mg/dL — ABNORMAL HIGH (ref 70–100)

## 2022-08-20 SURGERY — INSERTION, TRANSVAGINAL TAPE, BLADDER SUSPENSION, CYSTO
Anesthesia: Anesthesia General | Site: Pelvis | Wound class: Clean Contaminated

## 2022-08-20 MED ORDER — MIDAZOLAM HCL 1 MG/ML IJ SOLN (WRAP)
INTRAMUSCULAR | Status: AC
Start: 2022-08-20 — End: ?
  Filled 2022-08-20: qty 2

## 2022-08-20 MED ORDER — CEFAZOLIN SODIUM 1 G IJ SOLR
INTRAMUSCULAR | Status: AC
Start: 2022-08-20 — End: ?
  Filled 2022-08-20: qty 2000

## 2022-08-20 MED ORDER — HYDROMORPHONE HCL 1 MG/ML IJ SOLN
0.5000 mg | INTRAMUSCULAR | Status: DC | PRN
Start: 2022-08-20 — End: 2022-08-20

## 2022-08-20 MED ORDER — ONDANSETRON HCL 4 MG/2ML IJ SOLN
4.0000 mg | Freq: Once | INTRAMUSCULAR | Status: DC | PRN
Start: 2022-08-20 — End: 2022-08-20

## 2022-08-20 MED ORDER — FENTANYL CITRATE (PF) 50 MCG/ML IJ SOLN (WRAP)
INTRAMUSCULAR | Status: AC
Start: 2022-08-20 — End: ?
  Filled 2022-08-20: qty 2

## 2022-08-20 MED ORDER — PROPOFOL 10 MG/ML IV EMUL (WRAP)
INTRAVENOUS | Status: DC | PRN
Start: 2022-08-20 — End: 2022-08-20
  Administered 2022-08-20: 180 mg via INTRAVENOUS

## 2022-08-20 MED ORDER — LACTATED RINGERS IV SOLN
INTRAVENOUS | Status: DC
Start: 2022-08-20 — End: 2022-08-20

## 2022-08-20 MED ORDER — EPHEDRINE SULFATE 50 MG/ML IJ/IV SOLN (WRAP)
Status: AC
Start: 2022-08-20 — End: ?
  Filled 2022-08-20: qty 1

## 2022-08-20 MED ORDER — MIDAZOLAM HCL 1 MG/ML IJ SOLN (WRAP)
INTRAMUSCULAR | Status: DC | PRN
Start: 2022-08-20 — End: 2022-08-20
  Administered 2022-08-20: 2 mg via INTRAVENOUS

## 2022-08-20 MED ORDER — OXYCODONE HCL 5 MG PO TABS
ORAL_TABLET | ORAL | Status: AC
Start: 2022-08-20 — End: ?
  Filled 2022-08-20: qty 1

## 2022-08-20 MED ORDER — DEXAMETHASONE SODIUM PHOSPHATE 4 MG/ML IJ SOLN (WRAP)
INTRAMUSCULAR | Status: DC | PRN
Start: 2022-08-20 — End: 2022-08-20
  Administered 2022-08-20: 4 mg via INTRAVENOUS

## 2022-08-20 MED ORDER — OXYCODONE HCL 5 MG PO TABS
5.0000 mg | ORAL_TABLET | Freq: Once | ORAL | Status: AC | PRN
Start: 2022-08-20 — End: 2022-08-20
  Administered 2022-08-20: 5 mg via ORAL

## 2022-08-20 MED ORDER — LACTATED RINGERS IV SOLN
INTRAVENOUS | Status: DC | PRN
Start: 2022-08-20 — End: 2022-08-20

## 2022-08-20 MED ORDER — PROPOFOL 10 MG/ML IV EMUL (WRAP)
INTRAVENOUS | Status: AC
Start: 2022-08-20 — End: ?
  Filled 2022-08-20: qty 20

## 2022-08-20 MED ORDER — ACETAMINOPHEN 325 MG PO TABS
ORAL_TABLET | ORAL | Status: AC
Start: 2022-08-20 — End: ?
  Filled 2022-08-20: qty 2

## 2022-08-20 MED ORDER — LIDOCAINE HCL (PF) 1 % IJ SOLN
INTRAMUSCULAR | Status: AC
Start: 2022-08-20 — End: ?
  Filled 2022-08-20: qty 5

## 2022-08-20 MED ORDER — SODIUM CHLORIDE (PF) 0.9 % IJ SOLN
INTRAMUSCULAR | Status: AC
Start: 2022-08-20 — End: ?
  Filled 2022-08-20: qty 10

## 2022-08-20 MED ORDER — DEXAMETHASONE SODIUM PHOSPHATE 4 MG/ML IJ SOLN
INTRAMUSCULAR | Status: AC
Start: 2022-08-20 — End: ?
  Filled 2022-08-20: qty 1

## 2022-08-20 MED ORDER — METOCLOPRAMIDE HCL 5 MG/ML IJ SOLN
10.0000 mg | Freq: Once | INTRAMUSCULAR | Status: DC | PRN
Start: 2022-08-20 — End: 2022-08-20

## 2022-08-20 MED ORDER — BUPIVACAINE HCL (PF) 0.25 % IJ SOLN
INTRAMUSCULAR | Status: DC | PRN
Start: 2022-08-20 — End: 2022-08-20
  Administered 2022-08-20: 10 mL

## 2022-08-20 MED ORDER — LIDOCAINE HCL (PF) 1 % IJ SOLN
INTRAMUSCULAR | Status: DC | PRN
Start: 2022-08-20 — End: 2022-08-20
  Administered 2022-08-20: 50 mg via INTRAVENOUS

## 2022-08-20 MED ORDER — FENTANYL CITRATE (PF) 50 MCG/ML IJ SOLN (WRAP)
50.0000 ug | INTRAMUSCULAR | Status: DC | PRN
Start: 2022-08-20 — End: 2022-08-20

## 2022-08-20 MED ORDER — FENTANYL CITRATE (PF) 50 MCG/ML IJ SOLN (WRAP)
INTRAMUSCULAR | Status: DC | PRN
Start: 2022-08-20 — End: 2022-08-20
  Administered 2022-08-20: 25 ug via INTRAVENOUS
  Administered 2022-08-20: 50 ug via INTRAVENOUS
  Administered 2022-08-20: 25 ug via INTRAVENOUS

## 2022-08-20 MED ORDER — ACETAMINOPHEN 325 MG PO TABS
650.0000 mg | ORAL_TABLET | Freq: Once | ORAL | Status: AC | PRN
Start: 2022-08-20 — End: 2022-08-20
  Administered 2022-08-20: 650 mg via ORAL

## 2022-08-20 MED ORDER — ONDANSETRON HCL 4 MG/2ML IJ SOLN
INTRAMUSCULAR | Status: DC | PRN
Start: 2022-08-20 — End: 2022-08-20
  Administered 2022-08-20: 4 mg via INTRAVENOUS

## 2022-08-20 MED ORDER — STERILE WATER FOR INJECTION IJ/IV SOLN (WRAP)
2.0000 g | Freq: Three times a day (TID) | INTRAMUSCULAR | Status: DC
Start: 2022-08-20 — End: 2022-08-20
  Administered 2022-08-20: 2 g via INTRAVENOUS

## 2022-08-20 MED ORDER — ONDANSETRON HCL 4 MG/2ML IJ SOLN
INTRAMUSCULAR | Status: AC
Start: 2022-08-20 — End: ?
  Filled 2022-08-20: qty 2

## 2022-08-20 MED ORDER — CEPHALEXIN 500 MG PO CAPS
500.0000 mg | ORAL_CAPSULE | Freq: Three times a day (TID) | ORAL | 0 refills | Status: AC
Start: 2022-08-20 — End: 2022-08-25

## 2022-08-20 MED ORDER — EPHEDRINE SULFATE 50 MG/ML IJ/IV SOLN (WRAP)
Status: DC | PRN
Start: 2022-08-20 — End: 2022-08-20
  Administered 2022-08-20: 5 mg via INTRAVENOUS
  Administered 2022-08-20: 10 mg via INTRAVENOUS

## 2022-08-20 MED ORDER — OXYCODONE HCL 5 MG PO TABS
5.0000 mg | ORAL_TABLET | ORAL | 0 refills | Status: AC | PRN
Start: 2022-08-20 — End: 2022-08-27

## 2022-08-20 SURGICAL SUPPLY — 73 items
ADHESIVE SKIN CLOSURE DERMABOND ADVANCED (Skin Closure) ×1
ADHESIVE SKIN CLOSURE DERMABOND ADVANCED .7 ML LIQUID APPLICATOR (Skin Closure) ×1 IMPLANT
ADHESIVE SKNCLS 2 OCTYL CYNCRLT .7ML (Skin Closure) ×1
BAG DRAINAGE 2000 ML ANTIREFLUX TOWER SLIDE TAP PORT REINFORCE HANGER (Procedure Accessories) IMPLANT
BAG DRAINAGE 2000 ML MEDLINE ANTIREFLUX (Procedure Accessories)
BAG DRN 2000ML LF STRL ANRFLX TWR SLD (Procedure Accessories)
BLADE 15 BARD-PARKER RIB-BACK SAFETYLOCK (Blade) ×1
BLADE 15 BARD-PARKER RIB-BACK SAFETYLOCK CARBON STEEL SURGICAL (Blade) ×1 IMPLANT
BLADE SRG CBNSTL 15 BP RB-BCK SFLOK LF (Blade) ×1
COUNTER 40 COUNT BLOCK MAGNET MEDLINE (Needles) ×1
COUNTER 40 COUNT BLOCK MAGNET SHARPS FOAM (Needles) ×1 IMPLANT
COUNTER SHRP FM LF STRL 40 CNT BLCK MAG (Needles) ×1
COVER FLEXIBLE MEDLINE LIGHT HANDLE (Drape) ×1
COVER FLEXIBLE MEDLINE LIGHT HANDLE PLASTIC GREEN (Drape) ×1 IMPLANT
COVER LGHT HNDL PLS LF STRL FLXB DISP (Drape) ×1
DECANTER FLD LF STRL TRNSF DEV DISP (IV Supply) ×1
DECANTER FLUID TRANSFER DEVICE DISPOSABLE STERILE LATEX FREE (IV Supply) ×1 IMPLANT
GLOVE SRG PLISPRN 7.5 BGL PI LF STRL PF (Glove) ×2
GLOVE SURGICAL 7.5 BIOGEL PI POWDER FREE (Glove) ×2
GLOVE SURGICAL 7.5 BIOGEL PI POWDER FREE MICRO ROUGHENED BEAD CUFF (Glove) ×2 IMPLANT
HOOK RETRACTION W5 MM LONE STAR ELASTIC (Instrument) ×1
HOOK RETRACTION W5 MM LONE STAR ELASTIC STAY BLUNT (Instrument) ×1 IMPLANT
HOOK RTRCT LNSTR 5MM LF STRL EL STY BLNT (Instrument) ×1
JELLY LUB EZ LF STRL H2O SOL NGRS TRNLU (Irrigation Solutions) ×1 IMPLANT
KIT RM TURNOVER LF NS DISP (Kits) ×1
KIT ROOM TURNOVER NONSTERILE LATEX FREE DISPOSABLE (Kits) ×1 IMPLANT
NEEDLE HPO SS PP RW BD 25GA 1.5IN LF (Needles) ×1 IMPLANT
PITCHER STERILE (Other) ×1
PLUG CATH LF STRL FL CAP DRN LUM DISP (Procedure Accessories) ×1
PLUG CATHETER FOLEY CAP DRAINAGE LUMEN (Procedure Accessories) ×1 IMPLANT
RETRACTOR RING (Retractor) ×1
RING RETRACTOR L28.3 CM X W18.3 CM 2 (Retractor) ×1
RING RETRACTOR L28.3 CM X W18.3 CM 2 PEEL POUCH SELF RETAIN FLEXIBLE (Retractor) ×1 IMPLANT
SET IRR DEHP 10 GTT/ML STRG 81IN LF STRL (Tubing) ×1
SET IRRIGATION L81 IN 10 GTT/ML STRAIGHT (Tubing) ×1
SET IRRIGATION L81 IN 10 GTT/ML STRAIGHT NA DEHP BLADDER REGULATE (Tubing) ×1 IMPLANT
SET SURGICAL BASIN 1 PITCHER (Other) ×1
SET SURGICAL BASIN 1 PITCHER MEDLINE INDUSTRIES, INC. (Other) ×1 IMPLANT
SLING PUBOURETHRAL DESARA 4.5X1.1CM MESH (Sling) ×1 IMPLANT
SLING PUBOURETHRAL L4.5 CM X W1.1 CM (Sling) ×1 IMPLANT
SLING PUBOURETHRAL L4.5 CM X W1.1 CM MESH DESARA FEMALE (Sling) ×1 IMPLANT
SOLUTION IRR 0.9% NACL 3L URMTC LF PLS (Irrigation Solutions) ×1
SOLUTION IRR 0.9% NACL 500ML LF STRL PLS (Irrigation Solutions) ×1
SOLUTION IRRIGATION 0.9% SDM CHLORIDE 500ML PR BTTL ISOTONIC NONPRGNC (Irrigation Solutions) ×1 IMPLANT
SOLUTION IRRIGATION 0.9% SODIUM CHLORIDE (Irrigation Solutions) ×1
SOLUTION IRRIGATION 0.9% SODIUM CHLORIDE 3000 ML PLASTIC CONTAINER (Irrigation Solutions) ×1 IMPLANT
SUTURE ABS 2-0 SH PDS2 27IN MFL VIOL (Suture) ×1
SUTURE ABS 2-0 SH VCL 27IN BRD COAT UD (Suture) ×5
SUTURE COATED VICRYL 2-0 SH L27 IN BRAID (Suture) ×5
SUTURE COATED VICRYL 2-0 SH L27 IN BRAID COATED UNDYED ABSORBABLE (Suture) ×3 IMPLANT
SUTURE PDS II 2-0 SH L27 IN MONOFILAMENT (Suture) ×1
SUTURE PDS II 2-0 SH L27 IN MONOFILAMENT VIOLET ABSORBABLE (Suture) ×1 IMPLANT
SYRINGE 10 ML CONTROL CONCENTRIC TIP (Syringes, Needles) ×1
SYRINGE 10 ML CONTROL CONCENTRIC TIP PYROGEN FREE DEHP FREE LOK (Syringes, Needles) ×1 IMPLANT
SYRINGE 10 ML GRADUATE NONPYROGENIC DEHP (Syringes, Needles) ×1
SYRINGE 10 ML GRADUATE NONPYROGENIC DEHP FREE PVC FREE LOK MEDICAL (Syringes, Needles) ×1 IMPLANT
SYRINGE MED 10ML LL LF STRL CNTRL CONC (Syringes, Needles) ×1
SYRINGE MED 10ML LL LF STRL GRAD N-PYRG (Syringes, Needles) ×1
TRAY CATH PVP SIL 10ML 2.5L 14FR LTX 1 (Tray) ×1
TRAY CATHETER 1 LAYER FOLEY URINE METER CLOSED SYSTEM PVP SILICONE (Tray) ×1 IMPLANT
TRAY CATHETER MEDLINE 1 LAYER FOLEY (Tray) ×1
TRAY SKIN SCRUB MEDLINE PVP VINYL COTTON (Prep) ×1 IMPLANT
TRAY SKN SCRB PVP VNYL CTTN 4OZ 3OZ EKT (Prep) ×1
TRAY SRG LF STRL CYSTO DISP (Pack) ×1
TRAY SRG MINOR LITHOTOMY IFMC (Pack) ×1
TRAY SURGICAL CYSTO (Pack) ×1
TRAY SURGICAL CYSTO ALEX (Pack) ×1 IMPLANT
TRAY SURGICAL MINOR LITHOTOMY (Pack) ×1 IMPLANT
WATER STERILE PLASTIC CONTAINER 3000 ML (Irrigation Solutions) ×1 IMPLANT
WATER STERILE PLASTIC POUR BOTTLE 1000 (Irrigation Solutions) ×1
WATER STERILE PLASTIC POUR BOTTLE 1000 ML (Irrigation Solutions) ×1 IMPLANT
WATER STRL 1000ML LF PLS PR BTL (Irrigation Solutions) ×1
WATER STRL 3L URMTC LF PLS CNTNR (Irrigation Solutions) ×1

## 2022-08-20 NOTE — Discharge Instr - AVS First Page (Addendum)
Reason for your Hospital Admission:  Midurethral sling      Instructions for after your discharge:  For 6 weeks:  No lifting >10lbs  No swimming or tub baths  No strenuous exercise or intercourse         Post Anesthesia Discharge Instructions    Although you may be awake and alert in the recovery room, small amounts of anesthetic remain in your system for about 24 hours.  You may feel tired and sleepy during this time.      You are advised to go directly home from the hospital.    Plan to stay at home and rest for the remainder of the day.    It is advisable to have someone with you at home for 24 hours after surgery.    Do not operate a motor vehicle, or any mechanical or electrical equipment for the next 24 hours.      Be careful when you are walking around, you may become dizzy.  The effects of anesthesia and/or medications are still present and drowsiness may occur    Do not consume alcohol, tranquilizers, sleeping medications, or any other non prescribed medication for the remainder of the day.    Diet:  begin with liquids, progress your diet as tolerated or as directed by your surgeon.  Nausea and vomiting may occur in the next 24 hours.

## 2022-08-20 NOTE — Transfer of Care (Signed)
Anesthesia Transfer of Care Note    Patient: Stacie Christian    Procedures performed: Procedure(s):  INSERTION, TRANSVAGINAL TAPE, BLADDER SUSPENSION, CYSTO  CYSTOSCOPY    Anesthesia type: General LMA    Patient location:Phase I PACU    Last vitals:   Vitals:    08/20/22 0946   BP: 132/63   Pulse: 90   Resp: 16   Temp: 36.6 C (97.9 F)   SpO2: 97%       Post pain: Patient not complaining of pain, continue current therapy      Mental Status:Drowsy but arousable    Respiratory Function: tolerating nasal cannula    Cardiovascular: stable    Nausea/Vomiting: patient not complaining of nausea or vomiting    Hydration Status: adequate    Post assessment: no apparent anesthetic complications, no reportable events and no evidence of recall    Signed by: Marquis Lunch, CRNA  08/20/22 9:46 AM

## 2022-08-20 NOTE — H&P (Signed)
History and physical reviewed and patient examined.    Dquan Cortopassi MD

## 2022-08-20 NOTE — Op Note (Signed)
[facility]     Date of Operation: 08/20/2022   Patient Name: Stacie Christian     Date of Birth:  [birth year]     MRN:               [id number]      PREOPERATIVE DIAGNOSIS:    Stress urinary incontinence    POSTOPERATIVE DIAGNOSIS:   Same    SURGEON:   Rolly Salter, MD     ASSISTANT:   Circulator: Molli Posey, RN; Michelle Piper, RN  Scrub Person: Coolidge Breeze Bladimir  Preceptor: Darcus Pester, RN    ANESTHESIA:  General    OPERATION:   1. Transobturator Mid urethral mesh sling  2. Cystoscopy    FINDINGS:   1. Hypermobile urethra    COMPLICATIONS:   None    DRAINS:   none    SPECIMEN:   * No specimens in log *    BLOOD LOSS:   * No blood loss amount entered *     BLOOD PRODUCTS ADMINISTERED:   None    HISTORY OF PRESENT ILLNESS:   Stacie Christian is a 66 y.o. female with a history of stress urinary incontinence. The risks and benefits were discussed and the patient agrees with proceeding to surgery.     PROCEDURE IN DETAIL:  The patient was identified in the preoperative area and the risks and benefits were discussed. The patient was then brought back to the operative theater and placed supine on the operating room table. Anesthesia was administered along with pre-operative antibiotics and the patient was positioned in the lithotomy position, ensuring that all pressure points were padded and that no hyperextension/hyperflexion occurred. The patient was prepped and draped in sterile fashion. A surgical time out was conducted in the presence of nursing, the surgeon and anesthesia staff, and all agreed to proceed with the procedure as stated.    A lone star retractor was used to retract the labia.  A 28F foley catheter was placed and the bladder drained. An Allis clamp was used to grasp the distal urethra and a 0.25% Marcaine solution with Epinephrine was used to infiltrate the vaginal tissue overlying the mid-urethra. A 1.5cm incision was then made through the vaginal epithelium  and Metzenbaum scissors were used to dissect lateral to the urethra on either side towards the obturator membrane but not penetrating into it. Two stab incisions were then made in the genitofemoral fold at the level of the clitoris on each side. Helical trocars were then passed from these incisions into the vaginal incision with a finger in the vaginal area of dissection guiding the tip of the trocar. Sling was attached to the trocars and pulled into position over a #10 Hegar dilator.  The plastic sleeves were cut and removed leaving the sling in place. Tensioning was adjusted and the dilator was removed leaving the sling in a tension free position at the level of the mid urethra.     The vaginal incision was irrigated and then closed using a 2-0 vicryl suture in a running fashion. The foley catheter was removed and cystoscopy was performed. No bladder or urethral injury was seen. The bladder was left filled with approximately 150cc of saline. The sling was trimmed at the level of the skin and the skin incisions were sealed using skin glue.       The patient was awakened and transferred to the recovery in stable condition.   DISPOSITION/PLAN:   stable.  Rolly Salter, MD  08/20/2022, 10:03 AM

## 2022-08-20 NOTE — Anesthesia Postprocedure Evaluation (Signed)
Anesthesia Post Evaluation    Patient: Stacie Christian    Procedure(s):  INSERTION, TRANSVAGINAL TAPE, BLADDER SUSPENSION, CYSTO  CYSTOSCOPY    Anesthesia type: general    Last Vitals:   Vitals Value Taken Time   BP 129/56 08/20/22 1000   Temp 36.6 C (97.9 F) 08/20/22 0946   Pulse 89 08/20/22 1000   Resp 12 08/20/22 1000   SpO2 100 % 08/20/22 1000                 Anesthesia Post Evaluation:     Patient Evaluated: PACU  Patient Participation: complete - patient participated  Level of Consciousness: awake    Pain Management: adequate  Multimodal analgesia pain management approach    Airway Patency: patent        Anesthetic complications: No      PONV Status: none    Cardiovascular status: acceptable  Respiratory status: acceptable  Hydration status: acceptable          Signed by: Joaquim Lai, MD, 08/20/2022 10:46 AM

## 2022-08-20 NOTE — Transfer Summary (Signed)
VSS. Op site in good condition. Phase 1 Falman criteria met. Patient transitioned to Phase 2.

## 2022-08-20 NOTE — Discharge Summary -  Nursing (Signed)
Discharge criteria met. Patient is awake and alert. Pain level is controlled to tolerable level. Respirations easy and non labored. All vital signs remain stable. Peripad is clean dry and intact. Daughter is at bedside. Discharge instructions printed and discussed with patient and daughter, both acknowledged understanding of instructions. Copy of instructions provided to patient. Peripheral IV removed. Patient escorted to SDS entrance by RN via wheelchair, family member to drive patient home.

## 2022-08-20 NOTE — Anesthesia Preprocedure Evaluation (Signed)
Anesthesia Evaluation    AIRWAY    Mallampati: II    TM distance: <3 FB  Neck ROM: full  Mouth Opening:full   CARDIOVASCULAR    cardiovascular exam normal       DENTAL                   PULMONARY    clear to auscultation     OTHER FINDINGS    Denies loose teeth                                  Relevant Problems   ENDO   (+) DM (diabetes mellitus), type 2      OTHER   (+) Rheumatoid arthritis               Anesthesia Plan    ASA 3     general               (DM - stopped Semaglutide 1 week ago  HTN  GERD - denies current symptoms  L breast cancer s/p partial mastectomy with XRT  RA  S/p hernia repair 05/2022    04/2022: ECHO wnl    Denies hx of anesthesia complications)      intravenous induction   Detailed anesthesia plan: general LMA      Post Op: plan for postoperative opioid use    Post op pain management: per surgeon and PO analgesics    informed consent obtained    Plan discussed with CRNA.                   Signed by: Joaquim Lai, MD 08/20/22 8:39 AM

## 2022-08-21 ENCOUNTER — Encounter: Payer: Self-pay | Admitting: Urology

## 2022-08-24 ENCOUNTER — Inpatient Hospital Stay: Payer: Medicare Other

## 2022-08-27 ENCOUNTER — Inpatient Hospital Stay: Payer: Medicare Other

## 2022-08-31 ENCOUNTER — Inpatient Hospital Stay: Payer: Medicare Other

## 2022-09-03 ENCOUNTER — Inpatient Hospital Stay: Payer: Medicare Other

## 2022-09-04 ENCOUNTER — Ambulatory Visit
Admission: RE | Admit: 2022-09-04 | Discharge: 2022-09-04 | Disposition: A | Discharge status: Home / Self Care | Payer: Medicare Other | Source: Ambulatory Visit | Attending: Hematology & Oncology | Admitting: Hematology & Oncology

## 2022-09-04 ENCOUNTER — Ambulatory Visit: Admission: RE | Admit: 2022-09-04 | Payer: Medicare Other | Source: Ambulatory Visit

## 2022-09-04 DIAGNOSIS — C50912 Malignant neoplasm of unspecified site of left female breast: Secondary | ICD-10-CM

## 2022-09-04 DIAGNOSIS — Z170 Estrogen receptor positive status [ER+]: Secondary | ICD-10-CM

## 2022-09-04 DIAGNOSIS — C8213 Follicular lymphoma grade II, intra-abdominal lymph nodes: Secondary | ICD-10-CM

## 2022-09-10 NOTE — Progress Notes (Unsigned)
[facility]   [address] [place]  [phone]      [facility] Office  [phone]        Physician Requesting Consult: Referring, Not On File,*  PCP:     Reason for Consult: No diagnosis found.    Chief Complaint:  No diagnosis found.      Assessment    Stacie Christian is a 66 y.o.-year-old female with  follicular lymphoma.     In 2019, she presented with vomiting and abdominal pain and was found to have left upper abdominal mass inseparable from duodenum and jejunum as well as in the mesentery. S/p enteroscopy and exp lap. 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 S/p BR x 4 cycles (10/2018) and bypass surgery. Maintenance therapy with Rituximab q80months x 2 years.    PET/CT 03/07/21 showed CR for lymphoma but revealed a hypermetabolic focus in the inferior left breast (8mm, SUV 3.5)        Greater than 40 minutes was spent on this encounter with > 50% spent face to face with the patient discussing the above assessment and plan.         HPI:    Stacie Christian is a 66 y.o.-year-old female with a history of prior follicular lymphoma.     In 2019, she presented with vomiting and abdominal pain and was found to have left upper abdominal mass inseparable from duodenum and jejunum as well as in the mesentery. S/p enteroscopy and exp lap. 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 S/p BR x 4 cycles (10/2018) and bypass surgery. Maintenance therapy with Rituximab q103months x 2 years.    PET/CT 03/07/21 showed CR for lymphoma but revealed a hypermetabolic focus in the inferior left breast (8mm, SUV 3.5)    Symptoms: denies fever, chills, night sweats, weight loss, lymphadenopathy, abdominal discomfort     Denies F/C/N/V/diarrhea. No CP/SOB. No HA/visual changes/focal weakness or confusion.     Oncologic History:  A. Follicular lymphoma  In 2019, she presented with vomiting and abdominal pain and was found to have left upper abdominal  mass inseparable from duodenum and jejunum as well as in the mesentery. S/p enteroscopy and exp lap. 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 S/p BR x 4 cycles (10/2018) and bypass surgery. Maintenance therapy with Rituximab q38months x 2 years.    PET/CT 03/07/21 showed CR for lymphoma but revealed a hypermetabolic focus in the inferior left breast (8mm, SUV 3.5)    B. Breast Cancer- followed by Dr. Pandora Leiter  LEFT breast cancer cT1c N0(sn) M0 (stage 1A) s/p partial mastectomy/SLNB. HR+/HER2 1+, Oncotype RS=11, radiation (EOT 10/20/21). On Aromasin since 12/2021   PET/CT 03/07/21 showed CR for lymphoma but revealed a hypermetabolic focus in the inferior left breast (8mm, SUV 3.5)  04/17/21 MMG/US showed a 1.6cm irregular mass in the left breast (3 o'clock 7cm from the nipple) and a 1.2cm area of subtle architectural distortion in the left breast (1 o'clock middle depth)   Bx 3 o'clock lesion: invasive mammary carcinoma with DCIS (ER 100%, PR 95%, HER2 1+, Ki-67 30%).   Bx 1 o'clock lesion: benign fibrous stromal tissue.      06/17/21 LEFT breast partial mastectomy/SLNB: invasive mammary carcinoma, no special type, tumor size 1.2cm, No LVI, margins negative--pT1cN0  Oncotype RS=11 (distant recurrence risk at 9 years with TAM or AI 13%, no chemotherapy benefit)     06/27/21 DEXA  normal.   07/14/21 remission by PET/CT. No FDG avid lymphadenopathy.      S/p breast radiation (09/16/21-10/07/21) and boost (10/14/21-10/20/21)  Aromasin since 12/2021. Other than night sweats, she's been tolerating Aromasin well.      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   02/25/22 Bilateral MMG: benign     05/15/22 Established care with me.     Past Medical History:  Past Medical History:   Diagnosis Date    Anemia     Depression     Diabetes mellitus     Gastroesophageal reflux disease     Hepatitis 1990    A    Hypertension     Lymphoma 2019    non-Hodgkins, surgery and chemo     Malignant neoplasm of breast 2021    L partial mastectomy, radiation    Rheumatoid arthritis     Type 2 diabetes mellitus, controlled        Family Medical History:  Family History   Problem Relation Age of Onset    Cancer Mother     Cancer Father     Breast cancer Maternal Aunt        Social History:  Social History     Socioeconomic History    Marital status: Married   Tobacco Use    Smoking status: Never    Smokeless tobacco: Never   Vaping Use    Vaping Use: Never used   Substance and Sexual Activity    Alcohol use: Never     Comment: rarely    Drug use: Never    Sexual activity: Not Currently     Birth control/protection: None     Social Determinants of Health     Financial Resource Strain: Low Risk  (09/06/2022)    Overall Financial Resource Strain (CARDIA)     Difficulty of Paying Living Expenses: Not hard at all   Food Insecurity: No Food Insecurity (09/06/2022)    Hunger Vital Sign     Worried About Running Out of Food in the Last Year: Never true     Ran Out of Food in the Last Year: Never true   Transportation Needs: No Transportation Needs (09/06/2022)    PRAPARE - Therapist, art (Medical): No     Lack of Transportation (Non-Medical): No   Physical Activity: Insufficiently Active (09/06/2022)    Exercise Vital Sign     Days of Exercise per Week: 2 days     Minutes of Exercise per Session: 20 min   Stress: No Stress Concern Present (09/06/2022)    [employer] of Occupational Health - Occupational Stress Questionnaire     Feeling of Stress : Not at all   Social Connections: Moderately Integrated (09/06/2022)    Social Connection and Isolation Panel [NHANES]     Frequency of Communication with Friends and Family: Three times a week     Frequency of Social Gatherings with Friends and Family: Once a week     Attends Religious Services: More than 4 times per year     Active Member of [employer] or Organizations: No     Attends Banker Meetings: Never     Marital Status:  Married   Catering manager Violence: Not At Risk (09/06/2022)    Humiliation, Afraid, Rape, and Kick questionnaire     Fear of Current or Ex-Partner: No     Emotionally Abused: No     Physically Abused:  No     Sexually Abused: No   Housing Stability: Low Risk  (09/06/2022)    Housing Stability Vital Sign     Unable to Pay for Housing in the Last Year: No     Number of Places Lived in the Last Year: 2     Unstable Housing in the Last Year: No       9 grandchildren- ages 42 -5       ROS:  14 pt ROS reviewed with the patient and negative except for as documented in the HPI. All other systems reviewed.    Medications:  Current Outpatient Medications   Medication Sig Dispense Refill    celecoxib (CeleBREX) 200 MG capsule TAKE ONE (1) CAPSULE(S) BY MOUTH ONCE A DAY WITH FOOD FOR 30 DAYS.      chlorthalidone 25 MG tablet Take 1 tablet (25 mg) by mouth daily      CRANBERRY PO Take 36 mg by mouth daily      esomeprazole (NexIUM) 40 MG capsule Take 1 capsule (40 mg) by mouth daily      exemestane (AROMASIN) 25 MG tablet Take 1 tablet (25 mg) by mouth daily (Patient taking differently: Take 1 tablet (25 mg) by mouth nightly) 90 tablet 3    hydroxychloroquine (PLAQUENIL) 200 MG tablet Take 1 tablet (200 mg) by mouth nightly      lisinopril (ZESTRIL) 20 MG tablet Take 1 tablet (20 mg) by mouth daily      montelukast (SINGULAIR) 10 MG tablet Take 1 tablet (10 mg) by mouth nightly      PARoxetine (PAXIL) 20 MG tablet Take 1 tablet (20 mg) by mouth every evening      Semaglutide (Rybelsus) 7 MG Tab Take 1 tablet (7 mg) by mouth daily      UNABLE TO FIND Take 10 mg by mouth daily Med Name: Donperidone      UNABLE TO FIND Med Name: motilium domperidona 10 mg TID before meals      vitamin D, ergocalciferol, (DRISDOL) 50000 UNIT Cap TAKE ONE (1) CAPSULE(S) BY MOUTH EVERY WEEK.       No current facility-administered medications for this visit.         Physical Exam:    ECOG : 0  Pain score:    There were no vitals filed for this  visit.  Gen: well appearing alert well nourished female*** in NAD  HEENT: NCAT MMM OP clear no thrush no lesions  Neck: supple  Chest: respirations even and unlabored CTAB  CV: nl S1/S2 reg no mrg  LN: supraclav, cervical, axillary, and inguinal LNs with no palpable adenopathy  Abd: soft, NT ND +BS, no splenomegaly  Ext: wwp no c/c/e  Skin: no rashes or lesions appreciated  Neuro: alert, active, EOMI, face symm, palate up, tongue midline. Gait wnl.   MS: appropriate, conversant    Labs:  Reviewed in epic today  Lab Results   Component Value Date    WBC 7.05 08/07/2022    HGB 11.6 08/07/2022    HCT 36.4 08/07/2022    MCV 80.4 08/07/2022    PLT 256 08/07/2022     Lab Results   Component Value Date    CREAT 1.0 08/07/2022    BUN 15.0 08/07/2022    NA 140 08/07/2022    K 3.2 (L) 08/07/2022    CL 106 08/07/2022    CO2 24 08/07/2022     Lab Results   Component Value Date    ALT  17 08/07/2022    AST 18 08/07/2022    ALKPHOS 85 08/07/2022    BILITOTAL 0.6 08/07/2022     No results found for: "IRON", "TIBC", "FERRITIN"      Imaging:  Reviewed in epic today    No images are attached to the encounter.    Pathology:  Specimens (From admission, onward)      None              Via Rosado E. Haywood Lasso, MD   [facility]  [phone]

## 2022-09-11 ENCOUNTER — Ambulatory Visit
Payer: Medicare Other | Attending: Student in an Organized Health Care Education/Training Program | Admitting: Student in an Organized Health Care Education/Training Program

## 2022-09-11 ENCOUNTER — Encounter: Payer: Self-pay | Admitting: Student in an Organized Health Care Education/Training Program

## 2022-09-11 VITALS — BP 115/73 | HR 86 | Temp 98.6°F | Resp 16 | Wt 215.2 lb

## 2022-09-11 DIAGNOSIS — C8213 Follicular lymphoma grade II, intra-abdominal lymph nodes: Secondary | ICD-10-CM

## 2022-09-11 DIAGNOSIS — R110 Nausea: Secondary | ICD-10-CM | POA: Insufficient documentation

## 2022-09-11 MED ORDER — PROMETHAZINE HCL 12.5 MG PO TABS
12.5000 mg | ORAL_TABLET | Freq: Four times a day (QID) | ORAL | 1 refills | Status: AC | PRN
Start: 2022-09-11 — End: ?

## 2022-09-14 ENCOUNTER — Telehealth: Payer: Self-pay | Admitting: Hematology & Oncology

## 2022-09-14 ENCOUNTER — Telehealth: Payer: Self-pay

## 2022-09-14 NOTE — Telephone Encounter (Signed)
RN returned patient inquiry advising her Dr. Pandora Leiter returns to clinic on Wednesday, 09/16/22 someone from the office will let her know if Dr. Pandora Leiter prefers she reschedule.  Patient went to her MMG appointment and they prefer to reschedule once they receive prior imaging.   Appt has not been rescheduled pt stated they advised her they're working on it.    Care Team: Please follow up with Dr. Pandora Leiter   Thanks!

## 2022-09-14 NOTE — Telephone Encounter (Signed)
Patient asking if Dr. Pandora Leiter would want her to postpone her F/u (this Fri) until after she has her mammogram/US done? States imaging center ask her to reschedule her mammogram and breast US until after they have received her prior images done in [place].     Ph# [phone]

## 2022-09-16 ENCOUNTER — Telehealth: Payer: Self-pay

## 2022-09-16 NOTE — Telephone Encounter (Signed)
RN called patient to follow up if she had been contacted to reschedule her mammogram pt now sated it was cancelled on 09/04/22. She heard from someone at Dr. Bosie Clos office and her follow up on 09/18/22 a will be cancelled.  This RN confirmed Misty Stanley had spoke with the patient and will take care of cancelling the appointment.    Care team: Please follow up on mammogram as well

## 2022-09-16 NOTE — Telephone Encounter (Signed)
Ms. Grist feels well and has no concerns.  Her appointment with Dr. Pandora Leiter on 11/3 will be cancelled.  Her MMG has been delayed while facility waits for previous results from [place].  She will call and update [place] when MMG is scheduled.

## 2022-09-18 ENCOUNTER — Ambulatory Visit: Payer: Medicare Other | Admitting: Hematology & Oncology

## 2022-10-01 ENCOUNTER — Telehealth: Payer: Self-pay

## 2022-10-01 NOTE — Telephone Encounter (Signed)
Spoke with [facility] to determine if Mammogram CD from [facility] in [place] - necessary for comparison.  They have not received and resent request. Call [facility] [phone]) to confirm receipt of request - they had not, the were able to provide correct fax information - [phone].  Update [facility] and request for CD Mammogram (02/25/21) sent by them.  Updated Ms. Tesoro - will follow as she needs follow up with Dr. Pandora Leiter after MMG completed.

## 2022-10-12 ENCOUNTER — Telehealth: Payer: Self-pay

## 2022-10-12 NOTE — Telephone Encounter (Signed)
RN followed up with patient she has not been rescheduled for her MMG.   RN followed up with [facility] [phone].  Pt MMG was cancelled at [facility] 10/20 due to needing to review CD pt MMG 02/25/22 (report in media scanned on 04/27/22 [facility] [phone])   Appointment Information:  Visit Type: Scheduled Mammo Procedure  Provider: [facility]  Date: 09/04/22  Time: 10:30 AM  Arrival Time: 10:15 AM EDT  Department:   [facility]  [address]  [place] [address]  Dept: [phone]   Dept Fax: [phone]  RN spoke with Velna Hatchet she advised me to call Juanda Bond [phone]   RN spoke with Katrinka Blazing. called to inquire if CD was received was directed to medical records then was disconnected.  RN Called [place] [phone].  RN spoke with Mardene Celeste B.,she will reach out to her supervisor to inquire next steps.  RN scheduled MMG next available   Agent: Mardene Celeste B.  Date: Thursdays 01/20/22   Time: 11:00 am  Arrive: 10:45 am  Directions: No deodorant, lotion, or powders  Department:   [facility]AM[facility]A [facility]AN  [address]  [place] [address]  Dept: [phone]   Dept Fax: [phone]  RN called Garry Heater., patient was given an option to reschedule without prior records received pt. requested to wait until reports were received. CD has not been received they will request it again.  Patient made aware her appointment has been reschedule for next available.    Care team: MD/ RN  >> FYI

## 2022-10-13 ENCOUNTER — Telehealth: Payer: Self-pay

## 2022-10-13 ENCOUNTER — Telehealth: Payer: Self-pay | Admitting: Hematology & Oncology

## 2022-10-13 NOTE — Telephone Encounter (Signed)
Patient is scheduled for Mammography diagnostic bilateral  (order faxed)  Agent: Susy  Date: Wednesday 11/25/2022 (first opening)   Time: 1:30 pm  Arrival Time: 1:15 pm   Location: [facility] [address], [address],   [place], [place] [address]  Phone: [phone]  PREP: NO deodorant/lotions/perfumes around or on the breast/armpit area.      Follow up with Dr.Kang at the [facility] location on Friday 01/26 at 11:40 am.    Patient is aware of this information. My Chart message sent as well.      Stacie L.  10/13/2022  12:32 pm

## 2022-10-13 NOTE — Telephone Encounter (Signed)
Reviewed with Ms. Stacie Christian that scheduling team is working on [facility] scheduling.  Attempted to call [facility] in [facility] for last MMG CD - had spoken with them on 10/01/22.  Today computer system is "down" will call again.  [phone].

## 2022-10-16 ENCOUNTER — Other Ambulatory Visit: Payer: Self-pay

## 2022-10-16 DIAGNOSIS — Z1239 Encounter for other screening for malignant neoplasm of breast: Secondary | ICD-10-CM

## 2022-11-12 IMAGING — MG MAMMO DIAG BIL W/CAD TOMO
8 series · 8 of 24 positions shown · non-contrast
Comparison: Multiple prior exams most recently 06/17/2021

INDICATION: Left lumpectomy follow-up.
TECHNIQUE: Bilateral breast 2-D digital diagnostic mammogram was performed followed by 3-D tomosynthesis. Current study was also evaluated with a computer aided detection (CAD) system.

[R CC]
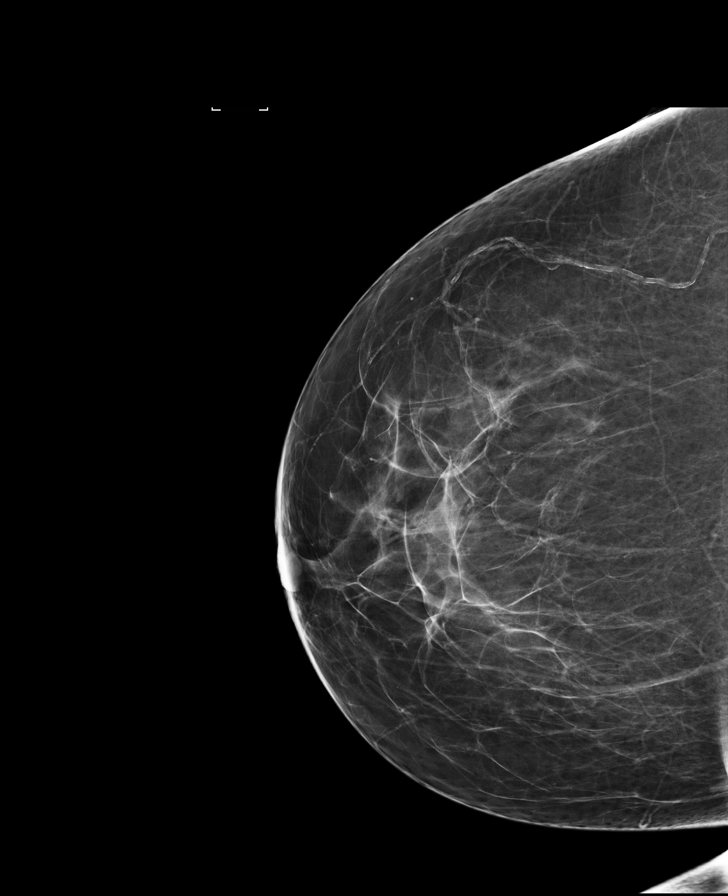

[L MLO]
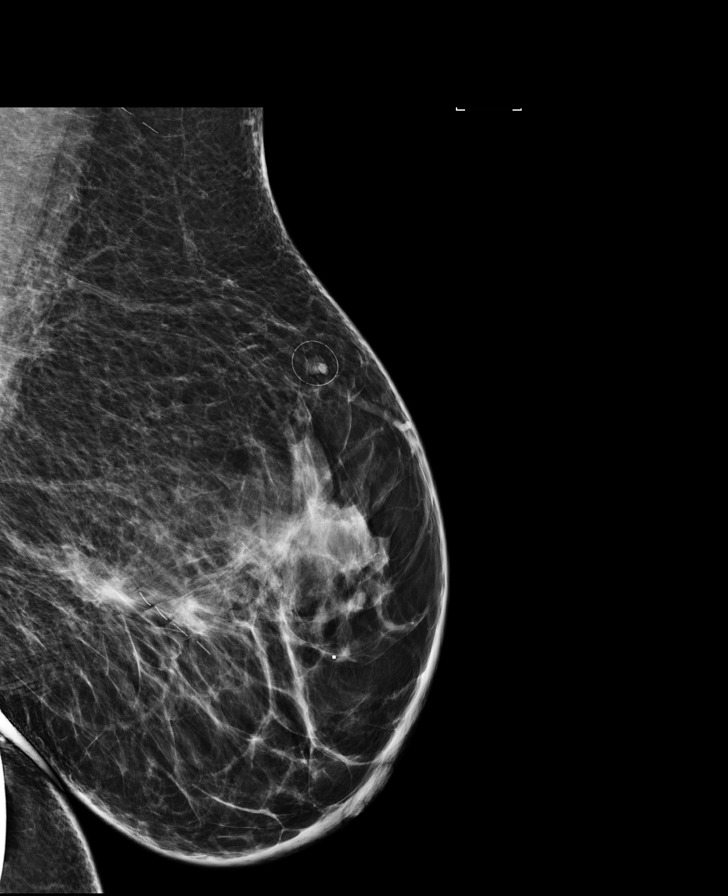

[R MLO]
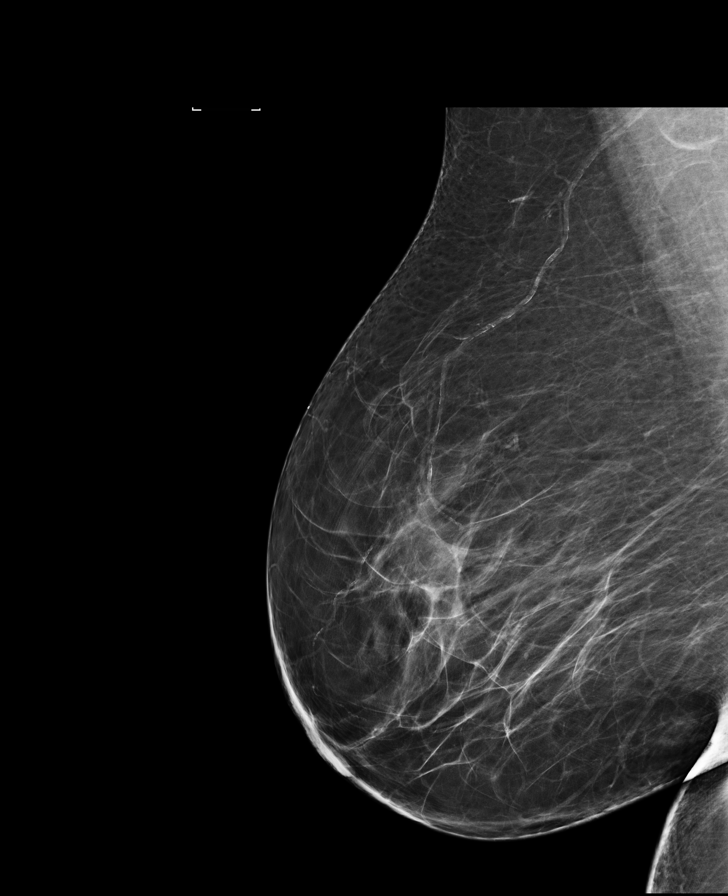

[L CC]
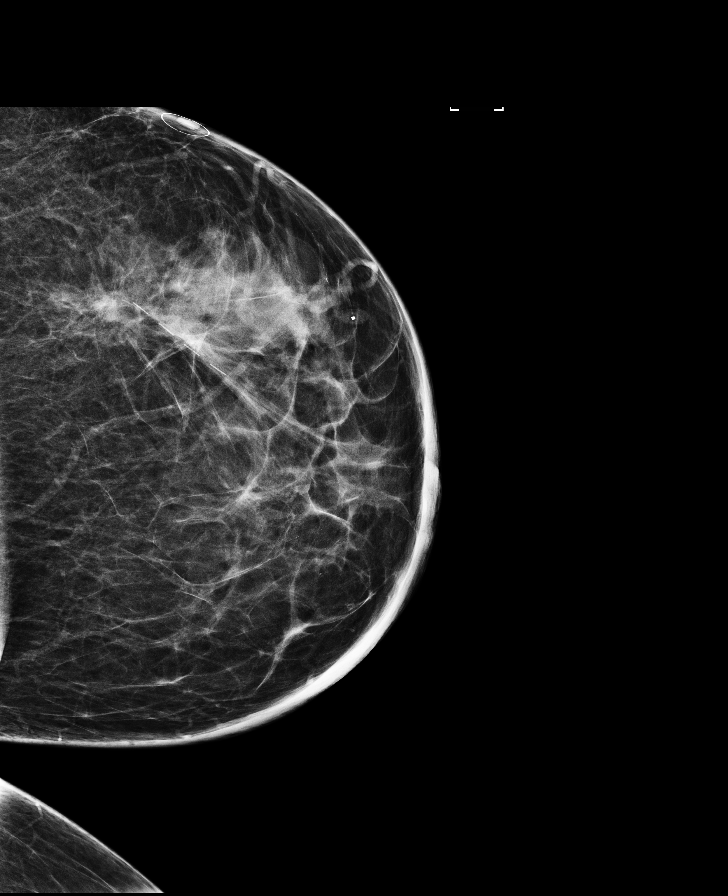

[L CC tomo · tomo slice 37/74.0]
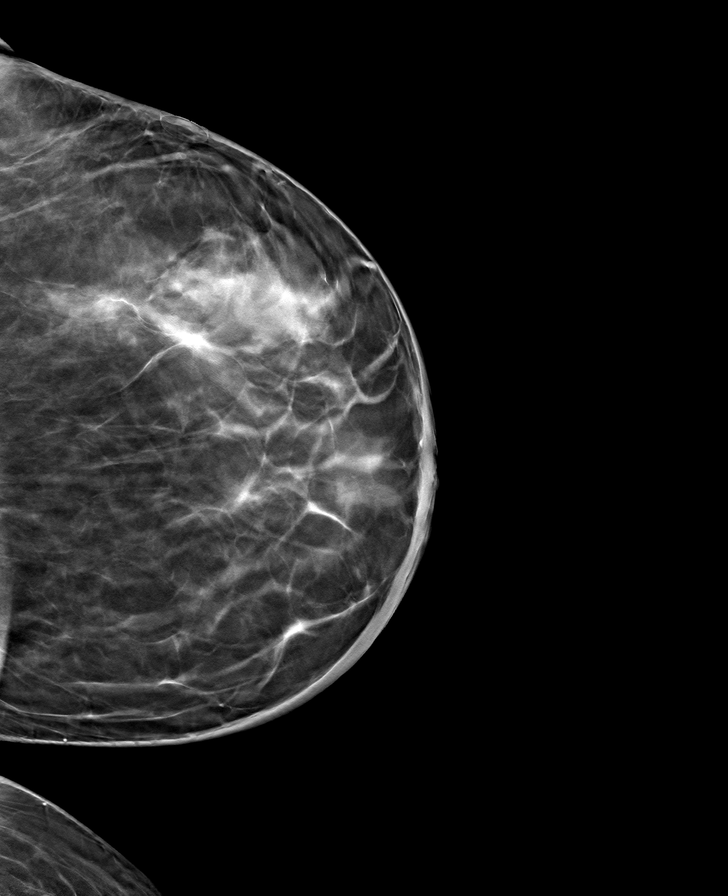

[R CC tomo · tomo slice 37/74.0]
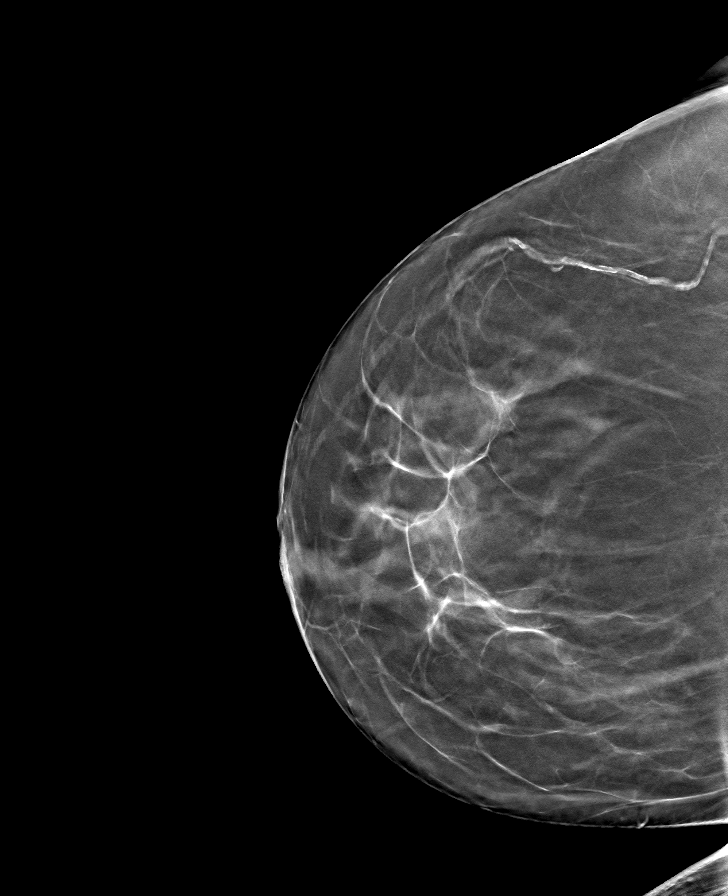

[R MLO tomo · tomo slice 43/84.0]
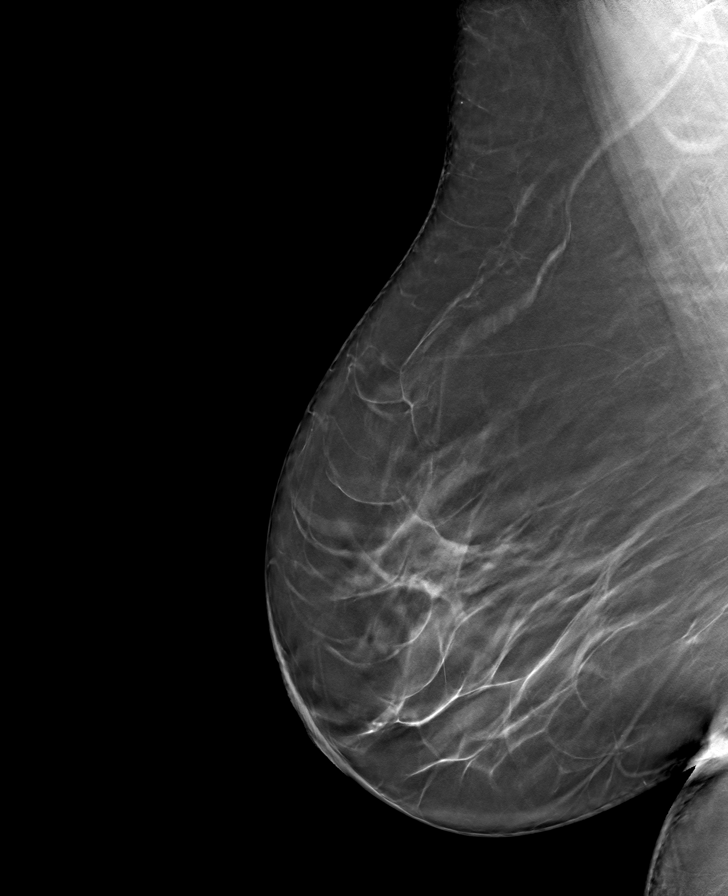

[L MLO tomo · tomo slice 43/85.0]
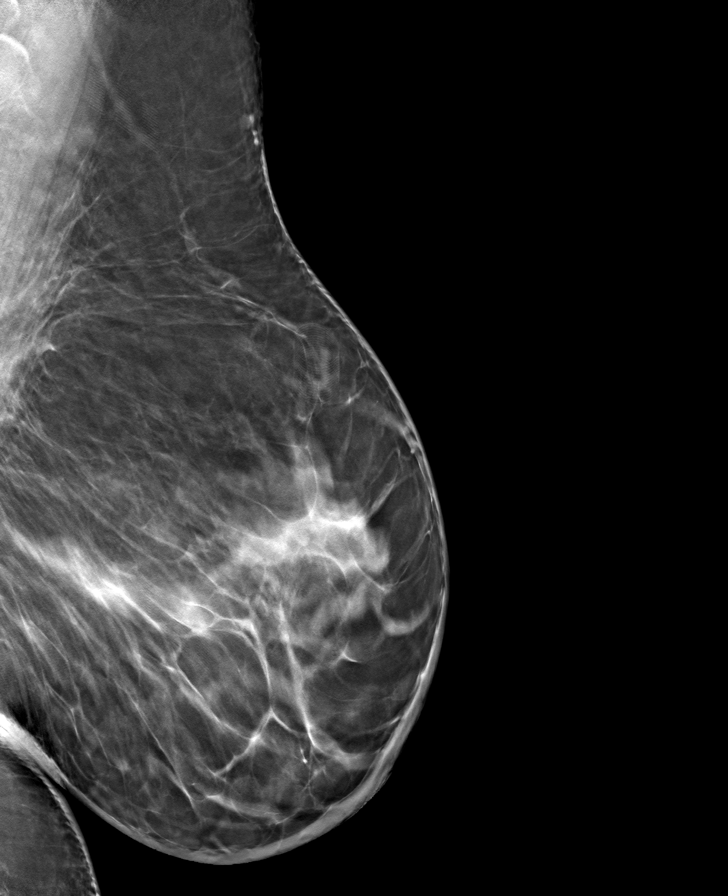

[8 of 24 positions shown; findings below may reference images not displayed]

FINDINGS: There are scattered areas of fibroglandular density. The patient is status post leftlumpectomy. No suspicious finding is seen in either breast.
IMPRESSION: Status post left lumpectomy with no mammographic evidence of malignancy. Follow-up with left diagnostic mammogram in 6 months as per lumpectomy protocol.

The patient received a copy of the results at the end of the examination.

FINAL ASSESSMENT: BI-RADS: Category 2 Benign

## 2022-11-25 ENCOUNTER — Other Ambulatory Visit: Payer: Self-pay | Admitting: Hematology & Oncology

## 2022-12-11 ENCOUNTER — Ambulatory Visit: Payer: Medicare Other | Attending: Hematology & Oncology | Admitting: Hematology & Oncology

## 2022-12-11 VITALS — BP 115/78 | HR 83 | Temp 98.2°F | Resp 16 | Ht 66.0 in | Wt 210.6 lb

## 2022-12-11 DIAGNOSIS — E118 Type 2 diabetes mellitus with unspecified complications: Secondary | ICD-10-CM

## 2022-12-11 DIAGNOSIS — F32A Depression, unspecified: Secondary | ICD-10-CM

## 2022-12-11 DIAGNOSIS — Z170 Estrogen receptor positive status [ER+]: Secondary | ICD-10-CM | POA: Insufficient documentation

## 2022-12-11 DIAGNOSIS — C50122 Malignant neoplasm of central portion of left male breast: Secondary | ICD-10-CM | POA: Insufficient documentation

## 2022-12-11 DIAGNOSIS — I10 Essential (primary) hypertension: Secondary | ICD-10-CM

## 2022-12-11 DIAGNOSIS — Z780 Asymptomatic menopausal state: Secondary | ICD-10-CM | POA: Insufficient documentation

## 2022-12-11 DIAGNOSIS — Z7985 Long-term (current) use of injectable non-insulin antidiabetic drugs: Secondary | ICD-10-CM

## 2022-12-11 DIAGNOSIS — K219 Gastro-esophageal reflux disease without esophagitis: Secondary | ICD-10-CM

## 2022-12-11 DIAGNOSIS — M069 Rheumatoid arthritis, unspecified: Secondary | ICD-10-CM

## 2022-12-11 DIAGNOSIS — C50112 Malignant neoplasm of central portion of left female breast: Secondary | ICD-10-CM

## 2022-12-11 DIAGNOSIS — C50912 Malignant neoplasm of unspecified site of left female breast: Secondary | ICD-10-CM | POA: Insufficient documentation

## 2022-12-11 NOTE — Progress Notes (Signed)
[facility]  [address] [place], [place], [place] [address]  P: [phone]      [facility]  [address], [address], [place], [place] [address]  P: [phone] F: [phone]      Date of Encounter: December 11, 2022    Identification: LEFT breast cancer cT1c N0(sn) M0 (stage 1A) s/p partial mastectomy/SLNB. HR+/HER2 1+, Oncotype RS=11, radiation (EOT 10/20/21). 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 In remission by PET/CT as of 02/2022.    Referring provider: self.    Oncology care team:  Breast surgeon: Treated in [place]  Rad onc: Treated in [place]  Med-onc: Transferring care from [place] --> Dr.Zaccai Chavarin Pandora Leiter  Malignant hematology: Finis Bud    Interval History:  She is here for follow up. Recent MMG is benign. 6 month f/u was recommended. She denies any pain or discomfort. CT C/A/P ordered by Dr.Hanlon is scheduled for 12/14/22.   She's tolerating Aromasin well. No particular complaints. She's not taking calcium but compliant with vitamin-D.    HPI:  Stacie Christian is a 67 y.o. year old woman who relocated from [place] to move in with her daughter.   Available records have been reviewed.     In 2019, she presented with vomiting and abdominal pain and was found to have left upper abdominal mass inseparable from duodenum and jejunum as well as in the mesentery. S/p enteroscopy and exp lap. 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 S/p BR x 4 cycles (10/2018) and bypass surgery. Maintenance therapy with Rituximab q23months x 2 years.    PET/CT 03/07/21 showed CR for lymphoma but revealed a hypermetabolic focus in the inferior left breast (8mm, SUV 3.5)  04/17/21 MMG/US showed a 1.6cm irregular mass in the left breast (3 o'clock 7cm from the nipple) and a 1.2cm area of subtle architectural distortion in the left breast (1 o'clock  middle depth)   Bx 3 o'clock lesion: invasive mammary carcinoma with DCIS (ER 100%, PR 95%, HER2 1+, Ki-67 30%).   Bx 1 o'clock lesion: benign fibrous stromal tissue.     06/17/21 LEFT breast partial mastectomy/SLNB: invasive mammary carcinoma, no special type, tumor size 1.2cm, No LVI, margins negative--pT1cN0  Oncotype RS=11 (distant recurrence risk at 9 years with TAM or AI 13%, no chemotherapy benefit)    06/27/21 DEXA normal.   07/14/21 remission by PET/CT. No FDG avid lymphadenopathy.     S/p breast radiation (09/16/21-10/07/21) and boost (10/14/21-10/20/21)  Aromasin since 12/2021. Other than night sweats, she's been tolerating Aromasin well.     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   02/25/22 Bilateral MMG: benign    05/15/22 Established care with me.   08/2022 Established care with Finis Bud for [place].   CT C/A/P ordered.     11/25/22 Bilateral MMG and Left [place] (bi-rads 3)  -Expected postsurgical changes of the left breast. Diffuse skin thickening in the left medial breast is felt to be related to posttreatment change.  -Probably benign left upper inner quadrant calcifications for which a six-month follow-up diagnostic mammogram is recommended.  -Unremarkable right breast.      Menopausal status: postmenopausal    Genetic test: N/A        Past Medical History:   Diagnosis Date    Anemia     Depression  Diabetes mellitus     Gastroesophageal reflux disease     Hepatitis 1990    A    Hypertension     Lymphoma 2019    non-Hodgkins, surgery and chemo    Malignant neoplasm of breast 2021    L partial mastectomy, radiation    Rheumatoid arthritis     Type 2 diabetes mellitus, controlled        Past Surgical History:   Procedure Laterality Date    ABDOMINAL SURGERY  2020    AXILLARY NODE DISSECTION  2022    CYSTOSCOPY N/A 08/20/2022    Procedure: CYSTOSCOPY;  Surgeon: Rolly Salter, MD;  Location: [facility];  Service: Urology;  Laterality: N/A;    EXCISION BIOPSY WITH NEEDLE  LOCALIZATION  2022    HYSTEROSCOPY      INSERTION, TRANSVAGINAL TAPE, BLADDER SUSPENSION, CYSTO N/A 08/20/2022    Procedure: INSERTION, TRANSVAGINAL TAPE, BLADDER SUSPENSION, CYSTO;  Surgeon: Rolly Salter, MD;  Location: [facility];  Service: Urology;  Laterality: N/A;    LAPAROSCOPIC, HERNIORRHAPHY, INCISIONAL N/A 06/04/2022    Procedure: LAPAROSCOPIC, HERNIORRHAPHY, INCISIONAL WITH MESH;  Surgeon: Francee Nodal, MD;  Location: [facility];  Service: General;  Laterality: N/A;  WITH TAP BLOCK    PARTIAL MASTECTOMY  2022       Family History   Problem Relation Age of Onset    Cancer Mother     Lung cancer Mother     Lung cancer Father     Cancer Father     Breast cancer Maternal Aunt        Current Outpatient Medications on File Prior to Visit   Medication Sig Dispense Refill    celecoxib (CeleBREX) 200 MG capsule TAKE ONE (1) CAPSULE(S) BY MOUTH ONCE A DAY WITH FOOD FOR 30 DAYS.      chlorthalidone 25 MG tablet Take 1 tablet (25 mg) by mouth daily      CRANBERRY PO Take 36 mg by mouth daily      esomeprazole (NexIUM) 40 MG capsule Take 1 capsule (40 mg) by mouth daily      exemestane (AROMASIN) 25 MG tablet Take 1 tablet (25 mg) by mouth daily (Patient taking differently: Take 1 tablet (25 mg) by mouth nightly) 90 tablet 3    hydroxychloroquine (PLAQUENIL) 200 MG tablet Take 1 tablet (200 mg) by mouth nightly      lisinopril (ZESTRIL) 20 MG tablet Take 1 tablet (20 mg) by mouth daily      montelukast (SINGULAIR) 10 MG tablet Take 1 tablet (10 mg) by mouth nightly      PARoxetine (PAXIL) 20 MG tablet Take 1 tablet (20 mg) by mouth every evening      promethazine (PHENERGAN) 12.5 MG tablet Take 1 tablet (12.5 mg) by mouth every 6 (six) hours as needed for Nausea 45 tablet 1    Semaglutide (Rybelsus) 7 MG Tab Take 1 tablet (7 mg) by mouth daily      UNABLE TO FIND Take 10 mg by mouth daily Med Name: Donperidone      UNABLE TO FIND Med Name: motilium domperidona 10 mg TID before meals      vitamin D,  ergocalciferol, (DRISDOL) 50000 UNIT Cap TAKE ONE (1) CAPSULE(S) BY MOUTH EVERY WEEK.       No current facility-administered medications on file prior to visit.       Social History     Socioeconomic History    Marital status: Married  Tobacco Use    Smoking status: Never    Smokeless tobacco: Never   Vaping Use    Vaping Use: Never used   Substance and Sexual Activity    Alcohol use: Never     Comment: rarely    Drug use: Never    Sexual activity: Not Currently     Birth control/protection: None     Social Determinants of Health     Financial Resource Strain: Low Risk  (12/08/2022)    Overall Financial Resource Strain (CARDIA)     Difficulty of Paying Living Expenses: Not hard at all   Food Insecurity: No Food Insecurity (12/08/2022)    Hunger Vital Sign     Worried About Running Out of Food in the Last Year: Never true     Ran Out of Food in the Last Year: Never true   Transportation Needs: No Transportation Needs (12/08/2022)    PRAPARE - Armed forces logistics/support/administrative officer (Medical): No     Lack of Transportation (Non-Medical): No   Physical Activity: Insufficiently Active (12/08/2022)    Exercise Vital Sign     Days of Exercise per Week: 1 day     Minutes of Exercise per Session: 30 min   Stress: No Stress Concern Present (12/08/2022)    [place]     Feeling of Stress : Not at all   Social Connections: Moderately Integrated (12/08/2022)    Social Connection and Isolation Panel [NHANES]     Frequency of Communication with Friends and Family: Three times a week     Frequency of Social Gatherings with Friends and Family: Once a week     Attends Religious Services: More than 4 times per year     Active Member of [employer] or Organizations: No     Attends Archivist Meetings: Never     Marital Status: Married   Human resources officer Violence: Not At Risk (12/08/2022)    Humiliation, Afraid, Rape, and Kick questionnaire     Fear of Current or  Ex-Partner: No     Emotionally Abused: No     Physically Abused: No     Sexually Abused: No   Housing Stability: Low Risk  (12/08/2022)    Housing Stability Vital Sign     Unable to Pay for Housing in the Last Year: No     Number of [place] in the Last Year: 2     Unstable Housing in the Last Year: No         ROS  Answers submitted by the patient for this visit:  Patient Intake Form (Submitted on 05/09/2022)  Chief Complaint: Symptoms  Did you ever breast feed?: Yes  fever: No  chills: No  Night sweats: No  fatigue: Yes  Unexpected weight loss: No  Unexpected weight gain: No  Appetite change: No  Frequent thirst: No  Double vision: No  Eye discharge: No  eye pain: No  Eye redness: No  hearing loss: No  Ringing in ears: No  ear pain: No  congestion: Yes  Sinus pain: No  Nose bleeds: No  sore throat: No  trouble swallowing: No  Voice change: No  Metallic or sour taste in mouth: No  difficulty breathing: No  Pain with breathing: No  Chronic cough: Yes  Bloody sputum: No  wheezing: Yes  Snoring: No  chest pain: No  palpitations: No  leg swelling: No  Pain walking: No  leg pain: No  Pacemaker problems: No  Irregular heartbeat: No  nausea: No  vomiting: No  diarrhea: No  abdominal pain: No  Bloody stool: No  constipation: No  heartburn: No  Regurgitation: No  Cold intolerance: No  Heat intolerance: No  Hot flashes: No  Hair loss: No  Difficulty urinating: No  urinary pain: No  Urinary frequency: Yes  bladder incontinence: Yes  Blood in urine: No  vaginal bleeding: No  vaginal discharge: No  pelvic pain: No  arthralgias: No  back pain: No  flank pain: No  myalgias: No  neck pain: No  Neck stiffness: Yes  muscle weakness: Yes  itching: No  rash: No  Hives: No  Wound: No  Skin changes: No  dizziness: No  headaches: No  seizures: No  numbness: No  tingling: No  tremors: No  weakness: No  memory loss: No  confusion: No  speech difficulty: No  Bruise or bleeds easily: Yes  Lymph node problems: No  nervous/anxious:  No  Depressed mood: No  Feeling agitated: No  Panic attack: No    Physical exam  Vitals:    12/11/22 1144   BP: 115/78   Pulse: 83   Resp: 16   Temp: 98.2 F (36.8 C)   SpO2: 97%     PS ECOG: 0  GENERAL APPEARANCE: well-developed, well-nourished, in no acute distress.  HEENT: Normocephalic and atraumatic. No scleral icterus. No conjunctival injection.   NECK: Supple.   BREASTS: LEFT partial mastectomy, well healed incisions. No palpable mass. +lymphedema in the left breast lower aspect and skin change c/w prior radiation. No axillary adenopathy  ABDOMEN: Soft, non-distended.   EXTREMITIES: No cyanosis, clubbing, or edema.  NEUROLOGIC: The patient is awake, alert, and oriented x3. No focal sensory or motor deficits are noted. Gait is normal.   PSYCHIATRIC: Appropriate mood and affect.  SKIN: No rashes in visualized area   LYMPHATICS: No bulky lymphadenopathy noted     Assessment/Plan  This is a 67 y.o. year old woman with     # stage 1A LEFT breast cancer, pT1c N0(sn) M0   -ER 100%, PR 95%, HER2 1+, Ki-67 30%  -06/17/21 LEFT breast partial mastectomy/SLNB: invasive mammary carcinoma, no special type, tumor size 1.2cm, No LVI, margins negative--pT1cN0  Oncotype RS=11 (distant recurrence risk at 9 years with TAM or AI 13%, no chemotherapy benefit)  -s/p radiation  -Aromasin since 12/2021. Well tolerated so far.   -Continue Aromasin for 5 years at least (-12/2026)    BMA  -DEXA normal in 06/2021.   -Repeat DEXA in 06/2023 (ordered)  -Continue Vitamin D. She's not taking calium.     Surveillance  -11/2022 MMG/left breast US reviewed-bi-rads 3. 66month f/u recommended. Pt is worried about "felt to be" and "probably benign" on the report.  Both MMG and MRI ordered.    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  -Left upper abdominal mass inseparable from duodenum and jejunum as well as in the mesentery. S/p enteroscopy and exp lap.    -S/p BR x 4 cycles (10/2018) and bypass surgery.  Maintenance therapy with Rituximab q11months x 2 years.   -In remission by PET/CT (02/2022)   -F/u with Dr.Hanlon.  -CT C/A/P scheduled for 12/14/22    # RA on Plaquenil and Celecoxib prn.  # DM, hypertension, GERD  # depression on Paxil.     F/u with me in August with  MMG and DEXA beforehand.   She may or may not proceed with MRI breast.   She will let me know if CT C/A/P reveals any abnormality in her breasts.       Thank you for allowing me to participate in the care of this patient. Please call with questions.     My total visit time for this patient encounter was 40 minutes. This includes time spent preparing to see the patient, performing the exam, counselling patient/family, ordering and reviewing tests, medications and/or procedures, care coordination, and documenting clinical information in the record.      [place] Ezell Poke, [place] [phone]  F [phone]

## 2022-12-14 ENCOUNTER — Other Ambulatory Visit (INDEPENDENT_AMBULATORY_CARE_PROVIDER_SITE_OTHER): Payer: Self-pay | Admitting: Student in an Organized Health Care Education/Training Program

## 2022-12-15 ENCOUNTER — Other Ambulatory Visit: Payer: Self-pay | Admitting: Student in an Organized Health Care Education/Training Program

## 2022-12-15 ENCOUNTER — Encounter: Payer: Self-pay | Admitting: Student in an Organized Health Care Education/Training Program

## 2022-12-15 DIAGNOSIS — R9389 Abnormal findings on diagnostic imaging of other specified body structures: Secondary | ICD-10-CM

## 2022-12-18 ENCOUNTER — Telehealth: Payer: Self-pay

## 2022-12-18 ENCOUNTER — Other Ambulatory Visit: Payer: Self-pay

## 2022-12-18 DIAGNOSIS — C8213 Follicular lymphoma grade II, intra-abdominal lymph nodes: Secondary | ICD-10-CM

## 2022-12-18 DIAGNOSIS — R932 Abnormal findings on diagnostic imaging of liver and biliary tract: Secondary | ICD-10-CM

## 2022-12-18 NOTE — Telephone Encounter (Signed)
Spoke with pt and informed her that Dr. Deetta Perla reviewed the CT scan report and will need a [place] RUQ instead of US pelvis. Clarified with pt why she does not need the pelvic US.  Pt amenable.     Informed her that the order will be placed and the US pelvis will be discontinued.   Pt appreciates the call and clarifying the order. She will make her [place] appt.  Reassured pt that Dr. Deetta Perla will get the results report and will touch base with her should she need to see a GI specialist. Pt expressed understanding.

## 2022-12-24 ENCOUNTER — Encounter: Payer: Self-pay | Admitting: Student in an Organized Health Care Education/Training Program

## 2022-12-24 ENCOUNTER — Telehealth: Payer: Self-pay

## 2022-12-24 ENCOUNTER — Ambulatory Visit
Admission: RE | Admit: 2022-12-24 | Discharge: 2022-12-24 | Disposition: A | Discharge status: Home / Self Care | Payer: Medicare Other | Source: Ambulatory Visit | Attending: Student in an Organized Health Care Education/Training Program | Admitting: Student in an Organized Health Care Education/Training Program

## 2022-12-24 DIAGNOSIS — R932 Abnormal findings on diagnostic imaging of liver and biliary tract: Secondary | ICD-10-CM | POA: Insufficient documentation

## 2022-12-24 DIAGNOSIS — C8213 Follicular lymphoma grade II, intra-abdominal lymph nodes: Secondary | ICD-10-CM | POA: Insufficient documentation

## 2022-12-24 NOTE — Telephone Encounter (Addendum)
Informed pt that Dr. Deetta Perla reviewed recent US results and it looked fine. Informed pt if she has gallbladder symptoms  like abdominal pain, nausea or  loss of appetite, she can see a Psychologist, sport and exercise. Otherwise no action required.     Per pt  she does not have gall bladder symptoms, though has slow digestion and constipation. 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 Though pt was interested to know if her symptoms maybe related to other GI issue. Pt follows with GI as an established patient. Advised pt to contact her GI provider for any concerning symptoms. She is amenable.         ----- Message from Meribeth Mattes, MD sent at 12/24/2022 10:41 AM EST -----    Stacie Christian's [place] looks fine. If she has gallbladder symptoms we can send her to a surgeon otherwise no action required.   [facility], MD

## 2023-01-09 ENCOUNTER — Encounter (INDEPENDENT_AMBULATORY_CARE_PROVIDER_SITE_OTHER): Payer: Self-pay | Admitting: Student in an Organized Health Care Education/Training Program

## 2023-01-09 ENCOUNTER — Ambulatory Visit (INDEPENDENT_AMBULATORY_CARE_PROVIDER_SITE_OTHER): Payer: Medicare Other | Admitting: Student in an Organized Health Care Education/Training Program

## 2023-01-09 VITALS — BP 96/68 | HR 91 | Temp 99.3°F | Resp 18 | Ht 66.0 in | Wt 203.0 lb

## 2023-01-09 DIAGNOSIS — B029 Zoster without complications: Secondary | ICD-10-CM

## 2023-01-09 MED ORDER — VALACYCLOVIR HCL 1 G PO TABS
1000.0000 mg | ORAL_TABLET | Freq: Three times a day (TID) | ORAL | 0 refills | Status: AC
Start: 2023-01-09 — End: 2023-01-16

## 2023-01-09 MED ORDER — KETOROLAC TROMETHAMINE 30 MG/ML IJ SOLN
15.0000 mg | Freq: Once | INTRAMUSCULAR | Status: AC
Start: 2023-01-09 — End: 2023-01-09
  Administered 2023-01-09: 15 mg via INTRAMUSCULAR

## 2023-01-09 NOTE — Progress Notes (Signed)
[facility]  CARE  PROGRESS NOTE     Patient: Stacie Christian   Date: 01/09/2023   MRN: [id number]       Stacie Christian is a 67 y.o. female      HISTORY     History obtained from: Patient    Chief Complaint   Patient presents with    Illness     Pt c/o shingles sx 2 days           Rash  This is a new problem. The current episode started yesterday. The affected locations include the torso. The rash is characterized by redness, blistering and pain. She was exposed to nothing. Past treatments include analgesics. The treatment provided mild relief.        Review of Systems   Skin:  Positive for rash.       History:    Pertinent Past Medical, Surgical, Family and Social History were reviewed.        Current Outpatient Medications:     celecoxib (CeleBREX) 200 MG capsule, TAKE ONE (1) CAPSULE(S) BY MOUTH ONCE A DAY WITH FOOD FOR 30 DAYS., Disp: , Rfl:     chlorthalidone 25 MG tablet, Take 1 tablet (25 mg) by mouth daily, Disp: , Rfl:     CRANBERRY PO, Take 36 mg by mouth daily, Disp: , Rfl:     esomeprazole (NexIUM) 40 MG capsule, Take 1 capsule (40 mg) by mouth daily, Disp: , Rfl:     exemestane (AROMASIN) 25 MG tablet, Take 1 tablet (25 mg) by mouth daily (Patient taking differently: Take 1 tablet (25 mg) by mouth nightly), Disp: 90 tablet, Rfl: 3    hydroxychloroquine (PLAQUENIL) 200 MG tablet, Take 1 tablet (200 mg) by mouth nightly, Disp: , Rfl:     lisinopril (ZESTRIL) 20 MG tablet, Take 1 tablet (20 mg) by mouth daily, Disp: , Rfl:     montelukast (SINGULAIR) 10 MG tablet, Take 1 tablet (10 mg) by mouth nightly, Disp: , Rfl:     PARoxetine (PAXIL) 20 MG tablet, Take 1 tablet (20 mg) by mouth every evening, Disp: , Rfl:     promethazine (PHENERGAN) 12.5 MG tablet, Take 1 tablet (12.5 mg) by mouth every 6 (six) hours as needed for Nausea, Disp: 45 tablet, Rfl: 1    Semaglutide (Rybelsus) 7 MG Tab, Take 1 tablet (7 mg) by mouth daily, Disp: , Rfl:     UNABLE TO FIND, Take 10 mg by mouth daily Med Name: Donperidone,  Disp: , Rfl:     vitamin D, ergocalciferol, (DRISDOL) 50000 UNIT Cap, TAKE ONE (1) CAPSULE(S) BY MOUTH EVERY WEEK., Disp: , Rfl:     valACYclovir (VALTREX) 1000 MG tablet, Take 1 tablet (1,000 mg) by mouth 3 (three) times daily for 7 days, Disp: 21 tablet, Rfl: 0  No current facility-administered medications for this visit.    No Known Allergies    Medications and Allergies reviewed.    PHYSICAL EXAM     Vitals:    01/09/23 1519   BP: 96/68   Pulse: 91   Resp: 18   Temp: 99.3 F (37.4 C)   TempSrc: Tympanic   SpO2: 98%   Weight: 92.1 kg (203 lb)   Height: 1.676 m ('5\' 6"'$ )       Physical Exam  Skin:     General: Skin is warm and dry.      Findings: Rash present.  Comments: Erythematous, blistering rash noted in indicated region.    Vitals reviewed.         UCC COURSE     There were no labs reviewed with this patient during the visit.    There were no x-rays reviewed with this patient during the visit.    No current facility-administered medications for this visit.       PROCEDURES     Procedures    MEDICAL DECISION MAKING     History, physical, labs/studies most consistent with shingles as the diagnosis.        Chart Review:  Prior PCP, Specialist and/or ED notes reviewed today: No  Prior labs/images/studies reviewed today: No    Differential Diagnosis:     See HPI for details.    67 year old female with past medical history of non-Hodgkin's lymphoma in remission, presents for rash just beneath the breast that wraps around to back.  Does not cross midline and follows single dermatome.  Blisters noted as well.  No weeping.  Reports significant pain, nonpruritic.    Valtrex prescribed for suspected shingles outbreak.    VSS and pt is in no acute distress. Pt ambulatory and breathing adequately. Stable for discharge at this time.     Treatment plan and return precautions discussed with patient. Encouraged followed up with PCP. Pt/family voices understanding and agrees with plan. Education provided.    This  note was generated by the Epic EMR system/ Dragon speech recognition and may contain inherent errors or omissions not intended by the user. Grammatical errors, random word insertions, deletions and pronoun errors  are occasional consequences of this technology due to software limitations. Not all errors are caught or corrected. If there are questions or concerns about the content of this note or information contained within the body of this dictation they should be addressed directly with the author for clarification.        ASSESSMENT     Encounter Diagnosis   Name Primary?    Herpes zoster without complication Yes                PLAN      PLAN: See MDM            No orders of the defined types were placed in this encounter.    Requested Prescriptions     Signed Prescriptions Disp Refills    valACYclovir (VALTREX) 1000 MG tablet 21 tablet 0     Sig: Take 1 tablet (1,000 mg) by mouth 3 (three) times daily for 7 days       Discussed results and diagnosis with patient/family.  Reviewed warning signs for worsening condition, as well as, indications for follow-up with primary care physician and return to urgent care clinic.   Patient/family expressed understanding of instructions.     An After Visit Summary was provided to the patient.

## 2023-01-09 NOTE — Patient Instructions (Signed)
Carefully review any attached education.    Please take any/all prescribed medications as directed.    Drink plenty of Fluids. Practice proper hand hygiene.    Monitor symptoms for changes an follow up with primary care provider.  If symptoms worsen, GO TO THE EMERGENCY ROOM.

## 2023-01-19 ENCOUNTER — Encounter (INDEPENDENT_AMBULATORY_CARE_PROVIDER_SITE_OTHER): Payer: Self-pay | Admitting: Surgery

## 2023-01-19 ENCOUNTER — Ambulatory Visit (INDEPENDENT_AMBULATORY_CARE_PROVIDER_SITE_OTHER): Payer: Medicare Other | Admitting: Surgery

## 2023-01-19 ENCOUNTER — Encounter (INDEPENDENT_AMBULATORY_CARE_PROVIDER_SITE_OTHER): Payer: Self-pay

## 2023-01-19 VITALS — BP 125/85 | HR 95 | Temp 97.8°F | Ht 66.0 in | Wt 205.0 lb

## 2023-01-19 DIAGNOSIS — C8590 Non-Hodgkin lymphoma, unspecified, unspecified site: Secondary | ICD-10-CM

## 2023-01-19 DIAGNOSIS — E119 Type 2 diabetes mellitus without complications: Secondary | ICD-10-CM

## 2023-01-19 DIAGNOSIS — K219 Gastro-esophageal reflux disease without esophagitis: Secondary | ICD-10-CM | POA: Insufficient documentation

## 2023-01-19 DIAGNOSIS — C50912 Malignant neoplasm of unspecified site of left female breast: Secondary | ICD-10-CM

## 2023-01-19 DIAGNOSIS — E6609 Other obesity due to excess calories: Secondary | ICD-10-CM

## 2023-01-19 DIAGNOSIS — M069 Rheumatoid arthritis, unspecified: Secondary | ICD-10-CM

## 2023-01-19 DIAGNOSIS — K8010 Calculus of gallbladder with chronic cholecystitis without obstruction: Secondary | ICD-10-CM

## 2023-01-19 DIAGNOSIS — Z6833 Body mass index (BMI) 33.0-33.9, adult: Secondary | ICD-10-CM

## 2023-01-19 DIAGNOSIS — I10 Essential (primary) hypertension: Secondary | ICD-10-CM | POA: Insufficient documentation

## 2023-01-19 NOTE — Progress Notes (Signed)
Visit Date Time: 01/19/2023  5:26 PM     Referring Provider:   Loyola Mast, MD  [facility] Provider: Iran Sizer, MD    Service: General Surgery    Reason For Visit:  Gallbladder Problem        History of Present Illness:   Stacie Christian is a 67 y.o. female who  has a past medical history of Anemia, Depression, Diabetes mellitus, Gastroesophageal reflux disease, Hepatitis (1990), Hypertension, Lymphoma (2019), Malignant neoplasm of breast (2021), Rheumatoid arthritis, and Type 2 diabetes mellitus, controlled..  She  presents with no symptoms but radiologic findings related to the gallbladder.  These include gallstones, thick-walled gallbladder, and CBD dilatation.  The patient denies pain and constipation and diarrhea.  .  There has been no additional workup.    The following data was personally reviewed during the evaluation of this problem:  previous medical records/operative notes, lab reports, imaging reports, and images or tracings    Additionally she was diagnosed with duodenal follicular lymphoma in [id number] and was obstructed. She reports a Roux-en-Y duodenojejunostomy bypass bypass of some  Then hernia developed and was repaired with Dr. Pincus Badder laparoscopically 05/2022      Past Medical History:     Past Medical History:   Diagnosis Date    Anemia     Depression     Diabetes mellitus     Gastroesophageal reflux disease     Hepatitis 1990    A    Hypertension     Lymphoma 2019    non-Hodgkins, surgery and chemo    Malignant neoplasm of breast 2021    L partial mastectomy, radiation    Rheumatoid arthritis     Type 2 diabetes mellitus, controlled        Past Surgical History:     Past Surgical History:   Procedure Laterality Date    ABDOMINAL SURGERY  2020    AXILLARY NODE DISSECTION  2022    CYSTOSCOPY N/A 08/20/2022    Procedure: CYSTOSCOPY;  Surgeon: Mallie Snooks, MD;  Location: [facility];  Service: Urology;  Laterality: N/A;    EXCISION BIOPSY WITH NEEDLE LOCALIZATION  2022    HYSTEROSCOPY       INSERTION, TRANSVAGINAL TAPE, BLADDER SUSPENSION, CYSTO N/A 08/20/2022    Procedure: INSERTION, TRANSVAGINAL TAPE, BLADDER SUSPENSION, CYSTO;  Surgeon: Mallie Snooks, MD;  Location: [facility];  Service: Urology;  Laterality: N/A;    LAPAROSCOPIC, HERNIORRHAPHY, INCISIONAL N/A 06/04/2022    Procedure: LAPAROSCOPIC, HERNIORRHAPHY, INCISIONAL WITH MESH;  Surgeon: Isaiah Blakes, MD;  Location: [facility];  Service: General;  Laterality: N/A;  WITH TAP BLOCK    PARTIAL MASTECTOMY  2022       Family History:     Family History   Problem Relation Age of Onset    Cancer Mother     Lung cancer Mother     Lung cancer Father     Cancer Father     Breast cancer Maternal Aunt        Social History:     Social History     Socioeconomic History    Marital status: Married   Tobacco Use    Smoking status: Never    Smokeless tobacco: Never   Vaping Use    Vaping Use: Never used   Substance and Sexual Activity    Alcohol use: Never     Comment: rarely    Drug use: Never    Sexual activity: Not  Currently     Birth control/protection: None     Social Determinants of Health     Financial Resource Strain: Low Risk  (12/08/2022)    Overall Financial Resource Strain (CARDIA)     Difficulty of Paying Living Expenses: Not hard at all   Food Insecurity: No Food Insecurity (12/08/2022)    Hunger Vital Sign     Worried About Running Out of Food in the Last Year: Never true     Ran Out of Food in the Last Year: Never true   Transportation Needs: No Transportation Needs (12/08/2022)    PRAPARE - Armed forces logistics/support/administrative officer (Medical): No     Lack of Transportation (Non-Medical): No   Physical Activity: Insufficiently Active (12/08/2022)    Exercise Vital Sign     Days of Exercise per Week: 1 day     Minutes of Exercise per Session: 30 min   Stress: No Stress Concern Present (12/08/2022)    [place]     Feeling of Stress : Not at all   Social Connections: Moderately  Integrated (12/08/2022)    Social Connection and Isolation Panel [NHANES]     Frequency of Communication with Friends and Family: Three times a week     Frequency of Social Gatherings with Friends and Family: Once a week     Attends Religious Services: More than 4 times per year     Active Member of [employer] or Organizations: No     Attends Archivist Meetings: Never     Marital Status: Married   Human resources officer Violence: Not At Risk (12/08/2022)    Humiliation, Afraid, Rape, and Kick questionnaire     Fear of Current or Ex-Partner: No     Emotionally Abused: No     Physically Abused: No     Sexually Abused: No   Housing Stability: Low Risk  (12/08/2022)    Housing Stability Vital Sign     Unable to Pay for Housing in the Last Year: No     Number of [place] in the Last Year: 2     Unstable Housing in the Last Year: No       Allergies:   Patient has no known allergies.    Medications:     Current Outpatient Medications   Medication Sig Dispense Refill    celecoxib (CeleBREX) 200 MG capsule TAKE ONE (1) CAPSULE(S) BY MOUTH ONCE A DAY WITH FOOD FOR 30 DAYS.      chlorthalidone 25 MG tablet Take 1 tablet (25 mg) by mouth daily      CRANBERRY PO Take 36 mg by mouth daily      esomeprazole (NexIUM) 40 MG capsule Take 1 capsule (40 mg) by mouth daily      exemestane (AROMASIN) 25 MG tablet Take 1 tablet (25 mg) by mouth daily (Patient taking differently: Take 1 tablet (25 mg) by mouth nightly) 90 tablet 3    hydroxychloroquine (PLAQUENIL) 200 MG tablet Take 1 tablet (200 mg) by mouth nightly      lisinopril (ZESTRIL) 20 MG tablet Take 1 tablet (20 mg) by mouth daily      montelukast (SINGULAIR) 10 MG tablet Take 1 tablet (10 mg) by mouth nightly      PARoxetine (PAXIL) 20 MG tablet Take 1 tablet (20 mg) by mouth every evening      Semaglutide (Rybelsus) 7 MG Tab Take 1 tablet (7 mg) by  mouth daily      UNABLE TO FIND Take 10 mg by mouth daily Med Name: Donperidone      vitamin D, ergocalciferol, (DRISDOL) 50000  UNIT Cap TAKE ONE (1) CAPSULE(S) BY MOUTH EVERY WEEK.      promethazine (PHENERGAN) 12.5 MG tablet Take 1 tablet (12.5 mg) by mouth every 6 (six) hours as needed for Nausea (Patient not taking: Reported on 01/19/2023) 45 tablet 1     No current facility-administered medications for this visit.       Review of Systems:     Review of Systems   Constitutional: Negative.    HENT: Negative.     Eyes: Negative.    Respiratory: Negative.     Cardiovascular: Negative.    Gastrointestinal:  Positive for constipation.   Genitourinary: Negative.    Musculoskeletal: Negative.    Skin: Negative.    Neurological: Negative.    Endo/Heme/Allergies: Negative.    Psychiatric/Behavioral: Negative.        As per the HPI and above. The patient otherwise denies any additional changes to their otic, opthalmologic, dermatologic, pulmonary, cardiac, gastrointestinal, genitourinary, musculoskeletal, hematologic, constitutional, or psychiatric systems.    Physical Exam:     Vitals:    01/19/23 0927   BP: 125/85   Pulse: 95   Temp: 97.8 F (36.6 C)   SpO2: 95%       Constitutional: Appears well, stated age.  Well nourished and well developed.  Eyes:  Conjunctiva and lids normal with no jaundice, PERRL.  Vision grossly intact.  Ear, Nose, Mouth and Throat:  Normal appearing external ears, nose and mouth.  Hearing grossly normal bilaterally.  Normal lips, teeth and gums.  Neck: Symmetric with no masses, thyromegaly, JVD, adenopathy, bruit, tenderness.  Good range of motion.  Midline trachea.  Respiratory:  Normal respiration with no effort.  No audible wheezing.  Normal and symmetric breath sounds.  No abnormalities to tenderness or percussion.  CV: Normal heart sounds with no murmur.  Normal carotid and pedal pulses.  No peripheral edema.  Abdomen: obese abdomen, nondistended, with no masses, organomegaly, or hernias.  Gallbladder is nonpalpable.  There is no tenderness without Murphy's sign.  Lymphatic: No adenopathy of neck, axillae or  groins.  Skin: Normal color and turgor with no rash, lesions or ulceration.  No induration or nodules on palpation.  Neuro: No gross deficits in cranial nerve function, sensation or motor function.  Psych: Normal judgement, insight, memory, mood, affect.  Oriented X 3.    Labs:    No results found for: "CBC", "BMP"   Lab Results   Component Value Date    ALT 17 08/07/2022    AST 18 08/07/2022    ALKPHOS 85 08/07/2022    BILITOTAL 0.6 08/07/2022      Rads:     Radiology Results (24 Hour)       ** No results found for the last 24 hours. **          US Abdomen Limited Ruq    Result Date: 12/24/2022  HISTORY: Previously described gallbladder wall thickening and intrahepatic biliary ductal dilatation. COMPARISON: CT scan of 12/14/2022. FINDINGS: Gallbladder: Small amount of sludge and multiple mobile shadowing gallstones. The largest measures 1.8 cm. There is mild gallbladder wall thickening. No pericholecystic fluid is seen. Bile ducts: Nondilated. Common bile duct measures 0.8 cm. Previously measured 0.6 cm on the CT scan. No filling defect is seen within the visualized portion. Liver: Normal echotexture. No focal mass.  Pancreas: Visualized portions appear normal. Aorta and IVC: Normal caliber where visualized. Right kidney: Measures 10.7 cm. No hydronephrosis.      1. There is no intrahepatic biliary ductal dilatation or solid mass. Previously described intrahepatic biliary ductal dilatation is not delineated on this study. 2. Cholelithiasis, sludge and gallbladder wall thickening. Findings are not pathognomonic for an acute cholecystitis, however, if there is high clinical concern consideration should be given to a nuclear medicine study. 3. There is no hydronephrosis within the right kidney. 4. The remainder is as above. 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 Primary hypertension  - Referral to Primary Care (EXTERNAL); Future    2. Type 2 diabetes mellitus without complication, without long-term current use of insulin  - Referral to Primary Care (EXTERNAL); Future    3. Gastroesophageal reflux disease, unspecified whether esophagitis present    4. Rheumatoid arthritis, involving unspecified site, unspecified whether rheumatoid factor present  - Referral to Primary Care (EXTERNAL); Future    5. Malignant neoplasm of left female breast, unspecified estrogen receptor status, unspecified site of breast    6. Non-Hodgkin's lymphoma, unspecified body region, unspecified non-Hodgkin lymphoma type    7. Class 1 obesity due to excess calories with serious comorbidity and body mass index (BMI) of 33.0 to 33.9 in adult  - Referral to Primary Care (EXTERNAL); Future    8. Calculus of gallbladder with chronic cholecystitis without obstruction  - Referral to Primary Care (EXTERNAL); Future     Plan:     Robotic assisted  PLAN: CHOLE.  Given the patient's current signs and symptoms, as well as radiological evidence, I recommend proceeding with a laparoscopic cholecystectomy. The risk and benefits of the procedure were outlined with the patient to include bleeding, infection, conversion to an open procedure, bile leak, retained stone(s) and injury to the common bile duct among other unforeseen events. The alternatives to surgery were also discussed. The patient understood and agreed to proceed. All questions  have been answered at this time.      I am requesting pre-operative medical clearance.    Hypertension may increase the patient's overall peri-operative risk. We will defer to the anesthesia department to assess this risk and order the appropriate pre-operative labs.  Given the patient's diagnosis of obesity, we discussed associated the increased risk of post-operative complications to include but not be limited to poorly healing wounds, poor immune response, hyperglycemia, and other metabolic derangements as well as post-operative pneumonia and the increased risk of venous thrombosis.  Diabetes may increase the patient's overall peri-operative risk including but not be limited to poorly healing wounds, poor immune response, hyperglycemia, and other metabolic derangements.  We will defer to the anesthesia department to assess this risk  and order the appropriate pre-operative labs.    Risk/benefit analysis commensurate with the ACS Risk Calculator was used to counsel the patient regarding surgery.  Thank you for sending this patient to [facility] for evaluation.  We appreciate the opportunity to participate in their care.  We will address the issue for which they were sent, and afterward return the patient to your care and/or their PCP for any ongoing issues.  If you have any questions, do not hesitate to contact us at [phone].  QM MIPS HTN - Specialists: Pt was informed that the blood pressure measured at today's office visit was elevated above goal.   Pt counseled on Lifestyle factors including those such as a heart healthy diet, weight management, alcohol intake, and/or exercise.  Advised patient to Follow-up with PCP for further monitoring and management of high blood pressure.    Signed by: Iran Sizer, MD

## 2023-01-21 ENCOUNTER — Ambulatory Visit: Payer: Medicare Other

## 2023-01-21 ENCOUNTER — Other Ambulatory Visit: Payer: Medicare Other

## 2023-02-11 ENCOUNTER — Encounter: Payer: Self-pay | Admitting: Surgery

## 2023-02-17 ENCOUNTER — Telehealth: Payer: Self-pay

## 2023-02-17 ENCOUNTER — Telehealth: Payer: Self-pay | Admitting: Hematology & Oncology

## 2023-02-17 NOTE — Telephone Encounter (Signed)
Called patient to reschedule appointment for August 2nd 2024 per Dr. Mertha Finders request. Patient mention she will be moving in June to [place] and will need to stop care.  She also wanted to ask if [facility] sent over her lab results to [place].  Call back # [phone]    Please advise         Stacie Christian.  4/3   10:25 am

## 2023-02-17 NOTE — Telephone Encounter (Signed)
Ms. Adderly called to ask for [facility] results.  Checked [employer] and unable to find results.  Spoke with Ms. Luan and testing was ordered by previous provider in [place] - she will call their office as mobile phlebotomy was completed last week.

## 2023-04-26 ENCOUNTER — Other Ambulatory Visit: Payer: Self-pay | Admitting: Hematology & Oncology

## 2023-04-26 DIAGNOSIS — C50912 Malignant neoplasm of unspecified site of left female breast: Secondary | ICD-10-CM

## 2023-05-18 ENCOUNTER — Ambulatory Visit: Payer: Medicare Other

## 2023-06-18 ENCOUNTER — Other Ambulatory Visit (INDEPENDENT_AMBULATORY_CARE_PROVIDER_SITE_OTHER): Payer: Medicare Other

## 2023-06-18 ENCOUNTER — Ambulatory Visit: Payer: Medicare Other | Admitting: Hematology & Oncology
